# Patient Record
Sex: Male | Born: 1990 | Race: White | Hispanic: No | Marital: Married | Smoking: Never smoker
Health system: Southern US, Community
[De-identification: ages and names within clinical notes are randomized; demographics above are authoritative.]

## PROBLEM LIST (undated history)

## (undated) DIAGNOSIS — F909 Attention-deficit hyperactivity disorder, unspecified type: Secondary | ICD-10-CM

## (undated) DIAGNOSIS — Z8619 Personal history of other infectious and parasitic diseases: Secondary | ICD-10-CM

## (undated) DIAGNOSIS — M2141 Flat foot [pes planus] (acquired), right foot: Secondary | ICD-10-CM

## (undated) HISTORY — PX: NO PAST SURGERIES: SHX2092

## (undated) HISTORY — DX: Personal history of other infectious and parasitic diseases: Z86.19

---
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
## 2014-06-05 ENCOUNTER — Encounter: Payer: Self-pay | Admitting: Family Medicine

## 2014-06-05 ENCOUNTER — Ambulatory Visit
Admission: EM | Admit: 2014-06-05 | Discharge: 2014-06-05 | Disposition: A | Discharge status: Home / Self Care | Payer: BLUE CROSS/BLUE SHIELD | Attending: Family Medicine | Admitting: Family Medicine

## 2014-06-05 DIAGNOSIS — R2242 Localized swelling, mass and lump, left lower limb: Secondary | ICD-10-CM | POA: Diagnosis present

## 2014-06-05 DIAGNOSIS — L02214 Cutaneous abscess of groin: Secondary | ICD-10-CM | POA: Diagnosis not present

## 2014-06-05 DIAGNOSIS — R350 Frequency of micturition: Secondary | ICD-10-CM | POA: Diagnosis present

## 2014-06-05 DIAGNOSIS — F909 Attention-deficit hyperactivity disorder, unspecified type: Secondary | ICD-10-CM | POA: Insufficient documentation

## 2014-06-05 DIAGNOSIS — R7303 Prediabetes: Secondary | ICD-10-CM

## 2014-06-05 DIAGNOSIS — R7309 Other abnormal glucose: Secondary | ICD-10-CM | POA: Diagnosis not present

## 2014-06-05 HISTORY — DX: Attention-deficit hyperactivity disorder, unspecified type: F90.9

## 2014-06-05 LAB — URINALYSIS COMPLETE WITH MICROSCOPIC (ARMC ONLY)
Bacteria, UA: NONE SEEN — AB
Bilirubin Urine: NEGATIVE
GLUCOSE, UA: NEGATIVE mg/dL
Hgb urine dipstick: NEGATIVE
Ketones, ur: NEGATIVE mg/dL
Leukocytes, UA: NEGATIVE
NITRITE: NEGATIVE
Protein, ur: NEGATIVE mg/dL
RBC / HPF: NONE SEEN RBC/hpf (ref <3)
Specific Gravity, Urine: 1.025 (ref 1.005–1.030)
Squamous Epithelial / LPF: NONE SEEN — AB
pH: 7 (ref 5.0–8.0)

## 2014-06-05 LAB — CBG MONITORING, ED: Glucose, fingerstick: 107

## 2014-06-05 LAB — GLUCOSE, CAPILLARY: GLUCOSE-CAPILLARY: 107 mg/dL — AB (ref 65–99)

## 2014-06-05 MED ORDER — MUPIROCIN 2 % EX OINT: 1.0000 "application " | TOPICAL_OINTMENT | Freq: Three times a day (TID) | CUTANEOUS | Status: DC

## 2014-06-05 MED ORDER — SULFAMETHOXAZOLE-TRIMETHOPRIM 800-160 MG PO TABS: 1.0000 | ORAL_TABLET | Freq: Two times a day (BID) | ORAL | Status: DC

## 2014-06-05 NOTE — Discharge Instructions (Signed)
Abscess An abscess is an infected area that contains a collection of pus and debris.It can occur in almost any part of the body. An abscess is also known as a furuncle or boil. CAUSES  An abscess occurs when tissue gets infected. This can occur from blockage of oil or sweat glands, infection of hair follicles, or a minor injury to the skin. As the body tries to fight the infection, pus collects in the area and creates pressure under the skin. This pressure causes pain. People with weakened immune systems have difficulty fighting infections and get certain abscesses more often.  SYMPTOMS Usually an abscess develops on the skin and becomes a painful mass that is red, warm, and tender. If the abscess forms under the skin, you may feel a moveable soft area under the skin. Some abscesses break open (rupture) on their own, but most will continue to get worse without care. The infection can spread deeper into the body and eventually into the bloodstream, causing you to feel ill.  DIAGNOSIS  Your caregiver will take your medical history and perform a physical exam. A sample of fluid may also be taken from the abscess to determine what is causing your infection. TREATMENT  Your caregiver may prescribe antibiotic medicines to fight the infection. However, taking antibiotics alone usually does not cure an abscess. Your caregiver may need to make a small cut (incision) in the abscess to drain the pus. In some cases, gauze is packed into the abscess to reduce pain and to continue draining the area. HOME CARE INSTRUCTIONS   Only take over-the-counter or prescription medicines for pain, discomfort, or fever as directed by your caregiver.  If you were prescribed antibiotics, take them as directed. Finish them even if you start to feel better.  If gauze is used, follow your caregiver's directions for changing the gauze.  To avoid spreading the infection:  Keep your draining abscess covered with a  bandage.  Wash your hands well.  Do not share personal care items, towels, or whirlpools with others.  Avoid skin contact with others.  Keep your skin and clothes clean around the abscess.  Keep all follow-up appointments as directed by your caregiver. SEEK MEDICAL CARE IF:   You have increased pain, swelling, redness, fluid drainage, or bleeding.  You have muscle aches, chills, or a general ill feeling.  You have a fever. MAKE SURE YOU:   Understand these instructions.  Will watch your condition.  Will get help right away if you are not doing well or get worse. Document Released: 10/14/2004 Document Revised: 07/06/2011 Document Reviewed: 03/19/2011 [facility] Patient Information 2015 [place], [place]. This information is not intended to replace advice given to you by your health care provider. Make sure you discuss any questions you have with your health care provider.  Abscess An abscess is an infected area that contains a collection of pus and debris.It can occur in almost any part of the body. An abscess is also known as a furuncle or boil. CAUSES  An abscess occurs when tissue gets infected. This can occur from blockage of oil or sweat glands, infection of hair follicles, or a minor injury to the skin. As the body tries to fight the infection, pus collects in the area and creates pressure under the skin. This pressure causes pain. People with weakened immune systems have difficulty fighting infections and get certain abscesses more often.  SYMPTOMS Usually an abscess develops on the skin and becomes a painful mass that is red,  warm, and tender. If the abscess forms under the skin, you may feel a moveable soft area under the skin. Some abscesses break open (rupture) on their own, but most will continue to get worse without care. The infection can spread deeper into the body and eventually into the bloodstream, causing you to feel ill.  DIAGNOSIS  Your caregiver will take your  medical history and perform a physical exam. A sample of fluid may also be taken from the abscess to determine what is causing your infection. TREATMENT  Your caregiver may prescribe antibiotic medicines to fight the infection. However, taking antibiotics alone usually does not cure an abscess. Your caregiver may need to make a small cut (incision) in the abscess to drain the pus. In some cases, gauze is packed into the abscess to reduce pain and to continue draining the area. HOME CARE INSTRUCTIONS   Only take over-the-counter or prescription medicines for pain, discomfort, or fever as directed by your caregiver.  If you were prescribed antibiotics, take them as directed. Finish them even if you start to feel better.  If gauze is used, follow your caregiver's directions for changing the gauze.  To avoid spreading the infection:  Keep your draining abscess covered with a bandage.  Wash your hands well.  Do not share personal care items, towels, or whirlpools with others.  Avoid skin contact with others.  Keep your skin and clothes clean around the abscess.  Keep all follow-up appointments as directed by your caregiver. SEEK MEDICAL CARE IF:   You have increased pain, swelling, redness, fluid drainage, or bleeding.  You have muscle aches, chills, or a general ill feeling.  You have a fever. MAKE SURE YOU:   Understand these instructions.  Will watch your condition.  Will get help right away if you are not doing well or get worse. Document Released: 10/14/2004 Document Revised: 07/06/2011 Document Reviewed: 03/19/2011 [facility] Patient Information 2015 [place], [place]. This information is not intended to replace advice given to you by your health care provider. Make sure you discuss any questions you have with your health care provider.  Abscess Care After An abscess (also called a boil or furuncle) is an infected area that contains a collection of pus. Signs and symptoms of  an abscess include pain, tenderness, redness, or hardness, or you may feel a moveable soft area under your skin. An abscess can occur anywhere in the body. The infection may spread to surrounding tissues causing cellulitis. A cut (incision) by the surgeon was made over your abscess and the pus was drained out. Gauze may have been packed into the space to provide a drain that will allow the cavity to heal from the inside outwards. The boil may be painful for 5 to 7 days. Most people with a boil do not have high fevers. Your abscess, if seen early, may not have localized, and may not have been lanced. If not, another appointment may be required for this if it does not get better on its own or with medications. HOME CARE INSTRUCTIONS   Only take over-the-counter or prescription medicines for pain, discomfort, or fever as directed by your caregiver.  When you bathe, soak and then remove gauze or iodoform packs at least daily or as directed by your caregiver. You may then wash the wound gently with mild soapy water. Repack with gauze or do as your caregiver directs. SEEK IMMEDIATE MEDICAL CARE IF:   You develop increased pain, swelling, redness, drainage, or bleeding in the  wound site.  You develop signs of generalized infection including muscle aches, chills, fever, or a general ill feeling.  An oral temperature above 102 F (38.9 C) develops, not controlled by medication. See your caregiver for a recheck if you develop any of the symptoms described above. If medications (antibiotics) were prescribed, take them as directed. Document Released: 07/23/2004 Document Revised: 03/29/2011 Document Reviewed: 03/20/2007 [facility] Patient Information 2015 [place], [place]. This information is not intended to replace advice given to you by your health care provider. Make sure you discuss any questions you have with your health care provider.

## 2014-06-05 NOTE — ED Provider Notes (Signed)
CSN: [id number]     Arrival date & time 06/05/14  1302 History   None    Chief Complaint  Patient presents with  . Urinary Frequency    1 month  . Mass    inner thigh-left   (Consider location/radiation/quality/duration/timing/severity/associated sxs/prior Treatment) HPI is a 24 year old gentleman who presents with 2 problems. The first is a lump on his inner left thigh medially near his groin. He states that last year he had a similar lump seen by a physician who told him it was a lymph node eventually subsided and has not bothered him since. He states that it's tender to the touch and rubs against his opposite thigh with walking which makes it irritated. Second problem is that of  frequency with urination but no nocturia. He denies any penile discharge. [place] any pain in his scrotum or penis. There is no decrease in the amount of urination but he does have slight urgency. He has had the same sexual contact for several years. He denies dysuria. He denies any blood or mucus in his urine.  Past Medical History  Diagnosis Date  . ADHD (attention deficit hyperactivity disorder)    History reviewed. No pertinent past surgical history. Family History  Problem Relation Age of Onset  . Family history unknown: Yes   History  Substance Use Topics  . Smoking status: Never Smoker   . Smokeless tobacco: Never Used  . Alcohol Use: Yes     Comment: occational    Review of Systems  Genitourinary: Positive for urgency and frequency.  All other systems reviewed and are negative.   Allergies  Review of patient's allergies indicates no known allergies.  Home Medications   Prior to Admission medications   Medication Sig Start Date End Date Taking? Authorizing Provider  mupirocin ointment (BACTROBAN) 2 % Apply 1 application topically 3 (three) times daily. 06/05/14   Lutricia FeilWilliam P Roemer, PA-C  sulfamethoxazole-trimethoprim (BACTRIM DS,SEPTRA DS) 800-160 MG per tablet Take 1 tablet by mouth 2 (two)  times daily. 06/05/14   Chrissie NoaWilliam P Roemer, PA-C   BP 160/94 mmHg  Pulse 80  Temp(Src) 99.8 F (37.7 C) (Oral)  Resp 16  Ht 5\' 11"  (1.803 m)  Wt 190 lb (86.183 kg)  BMI 26.51 kg/m2  SpO2 97% Physical Exam  Constitutional: He is oriented to person, place, and time. He appears well-developed and well-nourished.  HENT:  Head: Normocephalic and atraumatic.  Eyes: Pupils are equal, round, and reactive to light.  Neck: Normal range of motion. Neck supple.  Cardiovascular: Normal rate and regular rhythm.   Pulmonary/Chest: Effort normal and breath sounds normal.  Abdominal: Soft. Bowel sounds are normal. He exhibits no distension and no mass. There is no tenderness. There is no rebound and no guarding.  Genitourinary: Penis normal. No penile tenderness.  Examination shows a circumcised normal male phallus. Scrotum is nontender. Testes shows no masses. The epididymis is nontender. There is no charge from the penile meatus.  Musculoskeletal: Normal range of motion.  Neurological: He is alert and oriented to person, place, and time. He has normal reflexes.  Skin: Skin is dry.  Imaging the proximal left thigh lesion near the inguinal fold shows an erythematous: Tender area of folliculitis that is not pointing nor draining. There is induration present but no fluctuance. No lymphadenopathy is appreciated.    ED Course  Procedures (including critical care time) Labs Review Labs Reviewed  URINALYSIS COMPLETEWITH MICROSCOPIC [facility]  - Abnormal; Notable for the following:  Bacteria, UA NONE SEEN (*)    Squamous Epithelial / LPF NONE SEEN (*)    All other components within normal limits  GLUCOSE, CAPILLARY - Abnormal; Notable for the following:    Glucose-Capillary 107 (*)    All other components within normal limits  HEMOGLOBIN A1C  CBG MONITORING, ED    Imaging Review No results found.   MDM   1. Abscess of groin, left   2. Pre-diabetes     New Prescriptions   MUPIROCIN  OINTMENT (BACTROBAN) 2 %    Apply 1 application topically 3 (three) times daily.   SULFAMETHOXAZOLE-TRIMETHOPRIM (BACTRIM DS,SEPTRA DS) 800-160 MG PER TABLET    Take 1 tablet by mouth 2 (two) times daily.    L long discussion with the patient regarding her findings today. For his abscess in his left groin he will apply warm compresses 4 times a day. He will leave the compresses in place for 10 minutes. He'll follow this by drying and then applying Bactroban ointment 3 times a day with his last application at nighttime. His frequency could certainly be from prediabetes with a random CBG of 107. We had a long talk regarding this and he preferred to have been a hemoglobin A1c drawn today and await results. If the results are on the high side then he will follow-up immediately with [facility] for evaluation and treatment. In any regard I believe that he needs to follow-up with [facility] to follow his blood glucoses and the urinary frequency is been experiencing. I've also given him a prescription for Septra DS No. 10 that he would take 1 twice a day to assist in healing the abscess in the left groin.   Lutricia FeilWilliam P Roemer, PA-C 06/05/14 1455

## 2014-06-05 NOTE — ED Notes (Addendum)
Patient states he had a swollen lymph node on the left inner thigh 1 year ago and the lymph swelling has come back.  He also has urinary frequency that started about a month ago. He has not changed any drinking habits. He has been getting up at night to urinate. He is also thirsty in the night. He has a family history of diabetes.

## 2014-06-06 LAB — HEMOGLOBIN A1C: Hgb A1c MFr Bld: 5.1 % (ref 4.0–6.0)

## 2015-06-06 ENCOUNTER — Encounter: Payer: Self-pay | Admitting: Emergency Medicine

## 2015-06-06 ENCOUNTER — Ambulatory Visit
Admission: EM | Admit: 2015-06-06 | Discharge: 2015-06-06 | Disposition: A | Discharge status: Home / Self Care | Payer: Managed Care, Other (non HMO) | Attending: Family Medicine | Admitting: Family Medicine

## 2015-06-06 DIAGNOSIS — L0231 Cutaneous abscess of buttock: Secondary | ICD-10-CM | POA: Diagnosis not present

## 2015-06-06 MED ORDER — SULFAMETHOXAZOLE-TRIMETHOPRIM 800-160 MG PO TABS: 1.0000 | ORAL_TABLET | Freq: Two times a day (BID) | ORAL | Status: DC

## 2015-06-06 NOTE — ED Notes (Signed)
Patient c/o red bump on his buttock since Tuesday.  Patient reports redness, swelling and tenderness at the site.

## 2015-06-06 NOTE — Discharge Instructions (Signed)
Abscess An abscess is an infected area that contains a collection of pus and debris.It can occur in almost any part of the body. An abscess is also known as a furuncle or boil. CAUSES  An abscess occurs when tissue gets infected. This can occur from blockage of oil or sweat glands, infection of hair follicles, or a minor injury to the skin. As the body tries to fight the infection, pus collects in the area and creates pressure under the skin. This pressure causes pain. People with weakened immune systems have difficulty fighting infections and get certain abscesses more often.  SYMPTOMS Usually an abscess develops on the skin and becomes a painful mass that is red, warm, and tender. If the abscess forms under the skin, you may feel a moveable soft area under the skin. Some abscesses break open (rupture) on their own, but most will continue to get worse without care. The infection can spread deeper into the body and eventually into the bloodstream, causing you to feel ill.  DIAGNOSIS  Your caregiver will take your medical history and perform a physical exam. A sample of fluid may also be taken from the abscess to determine what is causing your infection. TREATMENT  Your caregiver may prescribe antibiotic medicines to fight the infection. However, taking antibiotics alone usually does not cure an abscess. Your caregiver may need to make a small cut (incision) in the abscess to drain the pus. In some cases, gauze is packed into the abscess to reduce pain and to continue draining the area. HOME CARE INSTRUCTIONS   Only take over-the-counter or prescription medicines for pain, discomfort, or fever as directed by your caregiver.  If you were prescribed antibiotics, take them as directed. Finish them even if you start to feel better.  If gauze is used, follow your caregiver's directions for changing the gauze.  To avoid spreading the infection:  Keep your draining abscess covered with a  bandage.  Wash your hands well.  Do not share personal care items, towels, or whirlpools with others.  Avoid skin contact with others.  Keep your skin and clothes clean around the abscess.  Keep all follow-up appointments as directed by your caregiver. SEEK MEDICAL CARE IF:   You have increased pain, swelling, redness, fluid drainage, or bleeding.  You have muscle aches, chills, or a general ill feeling.  You have a fever. MAKE SURE YOU:   Understand these instructions.  Will watch your condition.  Will get help right away if you are not doing well or get worse.   This information is not intended to replace advice given to you by your health care provider. Make sure you discuss any questions you have with your health care provider.   Document Released: 10/14/2004 Document Revised: 07/06/2011 Document Reviewed: 03/19/2011 Elsevier Interactive Patient Education [employer]   Follow up as needed if symptoms worsen

## 2015-08-06 DIAGNOSIS — L0231 Cutaneous abscess of buttock: Secondary | ICD-10-CM | POA: Diagnosis present

## 2015-08-06 NOTE — ED Provider Notes (Signed)
CSN: [id number]     Arrival date & time 06/06/15  0957 History   First MD Initiated Contact with Patient 06/06/15 1110     Chief Complaint  Patient presents with  . Abscess   (Consider location/radiation/quality/duration/timing/severity/associated sxs/prior Treatment) Patient is a 25 y.o. male presenting with abscess. The history is provided by the patient.  Abscess Abscess location: buttock. Abscess quality: draining (4cm)   Red streaking: yes   Duration:  4 days Progression:  Unchanged Chronicity:  New Context: not diabetes, not immunosuppression, not injected drug use, not insect bite/sting and not skin injury   Relieved by:  Warm compresses Associated symptoms: no anorexia, no fatigue, no fever, no headaches, no nausea and no vomiting   Risk factors: prior abscess     Past Medical History  Diagnosis Date  . ADHD (attention deficit hyperactivity disorder)    History reviewed. No pertinent past surgical history. Family History  Problem Relation Age of Onset  . Family history unknown: Yes   Social History  Substance Use Topics  . Smoking status: Never Smoker   . Smokeless tobacco: Never Used  . Alcohol Use: Yes     Comment: occational    Review of Systems  Constitutional: Negative for fever and fatigue.  Gastrointestinal: Negative for nausea, vomiting and anorexia.  Neurological: Negative for headaches.    Allergies  Review of patient's allergies indicates no known allergies.  Home Medications   Prior to Admission medications   Medication Sig Start Date End Date Taking? Authorizing Provider  mupirocin ointment (BACTROBAN) 2 % Apply 1 application topically 3 (three) times daily. 06/05/14   Lutricia FeilWilliam P Roemer, PA-C  sulfamethoxazole-trimethoprim (BACTRIM DS,SEPTRA DS) 800-160 MG tablet Take 1 tablet by mouth 2 (two) times daily. 06/06/15   Payton Mccallumrlando Christan Defranco, MD   Meds Ordered and Administered this Visit  Medications - No data to display  BP 145/90 mmHg  Pulse 77   Temp(Src) 98.2 F (36.8 C) (Tympanic)  Resp 16  Ht 5\' 11"  (1.803 m)  Wt 185 lb (83.915 kg)  BMI 25.81 kg/m2  SpO2 100% No data found.   Physical Exam  Constitutional: He appears well-developed and well-nourished. No distress.  Skin: He is not diaphoretic.  Right buttock with blanchable erythema, tenderness to palpation and warmth with slight drainage.  Nursing note and vitals reviewed.   ED Course  Procedures (including critical care time)  Labs Review Labs Reviewed - No data to display  Imaging Review No results found.   Visual Acuity Review  Right Eye Distance:   Left Eye Distance:   Bilateral Distance:    Right Eye Near:   Left Eye Near:    Bilateral Near:         MDM    Abscess buttock    Discharge Medication List as of 06/06/2015 12:11 PM    1. diagnosis reviewed with patient; area cleaned and anesthetized with 1% lidocaine and small incision made with 11 blade with drainage of purulent material 2. rx as per orders above; reviewed possible side effects, interactions, risks and benefits; rx for bactrim as per orders 3. Recommend supportive treatment with warm compresses to area 4. Follow-up prn if symptoms worsen or don't improve   Payton Mccallumrlando Idalys Konecny, MD 08/06/15 [phone]

## 2017-04-12 ENCOUNTER — Ambulatory Visit
Admission: EM | Admit: 2017-04-12 | Discharge: 2017-04-12 | Disposition: A | Discharge status: Home / Self Care | Payer: BLUE CROSS/BLUE SHIELD | Attending: Family Medicine | Admitting: Family Medicine

## 2017-04-12 ENCOUNTER — Other Ambulatory Visit: Payer: Self-pay

## 2017-04-12 DIAGNOSIS — M7652 Patellar tendinitis, left knee: Secondary | ICD-10-CM

## 2017-04-12 NOTE — ED Provider Notes (Signed)
[facility]    CSN: [id number] Arrival date & time: 04/12/17  0912     History   Chief Complaint Chief Complaint  Patient presents with  . Knee Pain    left    HPI William Meyers is a 27 y.o. male.   The history is provided by the patient.  Knee Pain  Location:  Knee Time since incident:  1 week Injury: no   Knee location:  L knee Pain details:    Quality:  Aching Chronicity:  New Dislocation: no   Prior injury to area:  No Relieved by:  None tried Exacerbated by: kneeling. Ineffective treatments:  None tried Associated symptoms: no back pain, no decreased ROM, no fatigue, no itching, no muscle weakness, no neck pain, no numbness, no stiffness, no swelling and no tingling   Risk factors: no known bone disorder, no obesity and no recent illness     Past Medical History:  Diagnosis Date  . ADHD (attention deficit hyperactivity disorder)     Patient Active Problem List   Diagnosis Date Noted  . Abscess of buttock, right 08/06/2015    Past Surgical History:  Procedure Laterality Date  . NO PAST SURGERIES         Home Medications    Prior to Admission medications   Medication Sig Start Date End Date Taking? Authorizing Provider  mupirocin ointment (BACTROBAN) 2 % Apply 1 application topically 3 (three) times daily. 06/05/14   Lutricia Feil, PA-C  sulfamethoxazole-trimethoprim (BACTRIM DS,SEPTRA DS) 800-160 MG tablet Take 1 tablet by mouth 2 (two) times daily. 06/06/15   Payton Mccallum, MD    Family History Family History  Family history unknown: Yes    Social History Social History   Tobacco Use  . Smoking status: Never Smoker  . Smokeless tobacco: Never Used  Substance Use Topics  . Alcohol use: Yes    Comment: rarely  . Drug use: No     Allergies   Patient has no known allergies.   Review of Systems Review of Systems  Constitutional: Negative for fatigue.  Musculoskeletal: Negative for back pain, neck pain and  stiffness.  Skin: Negative for itching.     Physical Exam Triage Vital Signs ED Triage Vitals  Enc Vitals Group     BP 04/12/17 0937 129/78     Pulse Rate 04/12/17 0937 (!) 50     Resp 04/12/17 0937 18     Temp 04/12/17 0937 98.3 F (36.8 C)     Temp Source 04/12/17 0937 Oral     SpO2 04/12/17 0937 100 %     Weight 04/12/17 0935 185 lb (83.9 kg)     Height 04/12/17 0935 5\' 11"  (1.803 m)     Head Circumference --      Peak Flow --      Pain Score 04/12/17 0935 10     Pain Loc --      Pain Edu? --      Excl. in GC? --    No data found.  Updated Vital Signs BP 129/78 (BP Location: Left Arm)   Pulse (!) 50   Temp 98.3 F (36.8 C) (Oral)   Resp 18   Ht 5\' 11"  (1.803 m)   Wt 185 lb (83.9 kg)   SpO2 100%   BMI 25.80 kg/m   Visual Acuity Right Eye Distance:   Left Eye Distance:   Bilateral Distance:    Right Eye Near:   Left Eye Near:  Bilateral Near:     Physical Exam  Constitutional: He appears well-developed and well-nourished. No distress.  Musculoskeletal:       Left knee: He exhibits normal range of motion, no swelling, no effusion, no ecchymosis, no deformity, no laceration, no erythema, normal alignment, no LCL laxity, normal patellar mobility, no bony tenderness, normal meniscus and no MCL laxity. Tenderness found. Patellar tendon tenderness noted.  Skin: He is not diaphoretic.  Nursing note and vitals reviewed.    UC Treatments / Results  Labs (all labs ordered are listed, but only abnormal results are displayed) Labs Reviewed - No data to display  EKG None Radiology No results found.  Procedures Procedures (including critical care time)  Medications Ordered in UC Medications - No data to display   Initial Impression / Assessment and Plan / UC Course  I have reviewed the triage vital signs and the nursing notes.  Pertinent labs & imaging results that were available during my care of the patient were reviewed by me and considered in my  medical decision making (see chart for details).      Final Clinical Impressions(s) / UC Diagnoses   Final diagnoses:  Patellar tendinitis of left knee    ED Discharge Orders    None     1. diagnosis reviewed with patient 2. rx as per orders above; reviewed possible side effects, interactions, risks and benefits  3. Recommend supportive treatment with ice, otc nsaids, use of knee pads if kneeling 4. Follow-up prn if symptoms worsen or don't improve   Controlled Substance Prescriptions [place] Controlled Substance Registry consulted? Not Applicable   Payton Mccallumonty, Marissa Weaver, MD 04/12/17 1051

## 2017-04-12 NOTE — ED Triage Notes (Signed)
Patient complains of left knee pain that worsens when he applies pressure to the knee. Patient describes the pain as a burning sensation.

## 2017-04-12 NOTE — Discharge Instructions (Signed)
Ibuprofen 600mg  three times a day; ice/heat; knee pads

## 2017-12-26 ENCOUNTER — Encounter: Payer: Self-pay | Admitting: Emergency Medicine

## 2017-12-26 ENCOUNTER — Other Ambulatory Visit: Payer: Self-pay

## 2017-12-26 ENCOUNTER — Ambulatory Visit
Admission: EM | Admit: 2017-12-26 | Discharge: 2017-12-26 | Disposition: A | Discharge status: Home / Self Care | Payer: BLUE CROSS/BLUE SHIELD | Attending: Family Medicine | Admitting: Family Medicine

## 2017-12-26 DIAGNOSIS — J111 Influenza due to unidentified influenza virus with other respiratory manifestations: Secondary | ICD-10-CM

## 2017-12-26 DIAGNOSIS — R69 Illness, unspecified: Secondary | ICD-10-CM | POA: Diagnosis not present

## 2017-12-26 DIAGNOSIS — R509 Fever, unspecified: Secondary | ICD-10-CM

## 2017-12-26 LAB — RAPID INFLUENZA A&B ANTIGENS
Influenza A (ARMC): NEGATIVE
Influenza B (ARMC): NEGATIVE

## 2017-12-26 LAB — RAPID STREP SCREEN (MED CTR MEBANE ONLY): Streptococcus, Group A Screen (Direct): NEGATIVE

## 2017-12-26 MED ORDER — AMOXICILLIN-POT CLAVULANATE 875-125 MG PO TABS: 1.0000 | ORAL_TABLET | Freq: Two times a day (BID) | ORAL | 0 refills | Status: DC

## 2017-12-26 NOTE — Discharge Instructions (Signed)
Rest. ° °Fluids. ° °Ibuprofen as needed. ° °Antibiotic as prescribed. ° °Take care ° °Dr. Elbert Polyakov °

## 2017-12-26 NOTE — ED Provider Notes (Signed)
[facility]    CSN: [id number] Arrival date & time: 12/26/17  1057  History   Chief Complaint Chief Complaint  Patient presents with  . Sore Throat  . Fever   HPI  27 year old male presents with respiratory symptoms.  Patient states that he has been sick since Thursday.  Started with postnasal drip and then progressed to congestion, fever, sore throat, chills, body aches.  His significant other is sick with similar symptoms.  However, she has no fever.  He reports persistent fever, T-max 103.  Has been treating himself with NyQuil, Sudafed, and Motrin without resolution.  Feels fatigued.  Symptoms are severe.  No known exacerbating factors.  No other associated symptoms.  No other complaints.  PMH, Surgical Hx, Family Hx, Social History reviewed and updated as below.  Past Medical History:  Diagnosis Date  . ADHD (attention deficit hyperactivity disorder)    Patient Active Problem List   Diagnosis Date Noted  . Abscess of buttock, right 08/06/2015    Past Surgical History:  Procedure Laterality Date  . NO PAST SURGERIES     Family History Family History  Family history unknown: Yes   Social History Social History   Tobacco Use  . Smoking status: Never Smoker  . Smokeless tobacco: Never Used  Substance Use Topics  . Alcohol use: Yes    Comment: rarely  . Drug use: No   Allergies   Patient has no known allergies.   Review of Systems Review of Systems Per HPI  Physical Exam Triage Vital Signs ED Triage Vitals  Enc Vitals Group     BP 12/26/17 1136 (!) 138/95     Pulse Rate 12/26/17 1136 70     Resp 12/26/17 1136 18     Temp 12/26/17 1136 98.3 F (36.8 C)     Temp Source 12/26/17 1136 Oral     SpO2 12/26/17 1136 100 %     Weight 12/26/17 1133 180 lb (81.6 kg)     Height 12/26/17 1133 5\' 11"  (1.803 m)     Head Circumference --      Peak Flow --      Pain Score 12/26/17 1132 5     Pain Loc --      Pain Edu? --      Excl. in GC? --     Updated Vital Signs BP (!) 138/95 (BP Location: Left Arm)   Pulse 70   Temp 98.3 F (36.8 C) (Oral)   Resp 18   Ht 5\' 11"  (1.803 m)   Wt 81.6 kg   SpO2 100%   BMI 25.10 kg/m   Visual Acuity Right Eye Distance:   Left Eye Distance:   Bilateral Distance:    Right Eye Near:   Left Eye Near:    Bilateral Near:     Physical Exam  Constitutional: He is oriented to person, place, and time. He appears well-developed. No distress.  HENT:  Head: Normocephalic and atraumatic.  Mouth/Throat: Oropharynx is clear and moist.  Eyes: Conjunctivae are normal. Right eye exhibits no discharge. Left eye exhibits no discharge.  Cardiovascular: Normal rate and regular rhythm.  Pulmonary/Chest: Effort normal and breath sounds normal. He has no wheezes. He has no rales.  Neurological: He is alert and oriented to person, place, and time.  Psychiatric: He has a normal mood and affect. His behavior is normal.  Nursing note and vitals reviewed.  UC Treatments / Results  Labs (all labs ordered are listed, but only  abnormal results are displayed) Labs Reviewed  RAPID STREP SCREEN ([facility] ONLY)  RAPID INFLUENZA A&B ANTIGENS ([facility] ONLY)  CULTURE, GROUP A STREP [facility])    EKG None  Radiology No results found.  Procedures Procedures (including critical care time)  Medications Ordered in UC Medications - No data to display  Initial Impression / Assessment and Plan / UC Course  I have reviewed the triage vital signs and the nursing notes.  Pertinent labs & imaging results that were available during my care of the patient were reviewed by me and considered in my medical decision making (see chart for details).    27 year old male presents with influenza-like illness.  Given persistent fever and lack of improvement, I am placing him on Augmentin to cover for sinusitis.  Final Clinical Impressions(s) / UC Diagnoses   Final diagnoses:  Influenza-like illness     Discharge  Instructions     Rest. Fluids.  Ibuprofen as needed.  Antibiotic as prescribed.  Take care  Dr. Adriana Simasook    ED Prescriptions    Medication Sig Dispense Auth. Provider   amoxicillin-clavulanate (AUGMENTIN) 875-125 MG tablet Take 1 tablet by mouth every 12 (twelve) hours. 14 tablet Tommie Samsook, Sevyn Markham G, DO     Controlled Substance Prescriptions [place] Controlled Substance Registry consulted? Not Applicable   Tommie SamsCook, Monseratt Ledin G, DO 12/26/17 1457

## 2017-12-26 NOTE — ED Triage Notes (Signed)
Pt c/o post nasal drip, nasal congestion, fever (101-103) sore throat, sinus pain/pressure, chills and body aches. Started 5 days ago.

## 2017-12-29 LAB — CULTURE, GROUP A STREP (THRC)

## 2019-01-01 ENCOUNTER — Encounter: Payer: Self-pay | Admitting: Emergency Medicine

## 2019-01-01 ENCOUNTER — Ambulatory Visit
Admission: EM | Admit: 2019-01-01 | Discharge: 2019-01-01 | Disposition: A | Discharge status: Home / Self Care | Payer: Self-pay | Attending: Family Medicine | Admitting: Family Medicine

## 2019-01-01 ENCOUNTER — Other Ambulatory Visit: Payer: Self-pay

## 2019-01-01 DIAGNOSIS — M79671 Pain in right foot: Secondary | ICD-10-CM

## 2019-01-01 HISTORY — DX: Flat foot (pes planus) (acquired), right foot: M21.41

## 2019-01-01 MED ORDER — MELOXICAM 15 MG PO TABS: 15.0000 mg | ORAL_TABLET | Freq: Every day | ORAL | 0 refills | Status: DC | PRN

## 2019-01-01 NOTE — Discharge Instructions (Signed)
Take medication as prescribed. Rest. Ice. Compression sleeve and arch supports over the counter.   Follow up with podiatry this week as discussed.   Follow up with your primary care physician this week as needed. Return to Urgent care for new or worsening concerns.

## 2019-01-01 NOTE — ED Triage Notes (Signed)
Patient in today c/o right foot pain x 3 days. No injury noted. Patient has tried ice/heat and elevation without relief.

## 2019-01-01 NOTE — ED Provider Notes (Signed)
[facility] ____________________________________________  Time seen: Approximately 11:09 AM  I have reviewed the triage vital signs and the nursing notes.   HISTORY  Chief Complaint Foot Pain (right)   HPI William Meyers is a 28 y.o. male presenting for evaluation of right medial foot pain present for the last 3 to 4 days.  Patient reports pain started first without any direct injury.  Reports he has subsequently rolled out of bed and hit his foot but denies any changes from that pain or injury.  Patient reports pain is mostly with activity and increases with the more activity he does.  Occasionally has some pain radiation to the right lower inner leg.  Denies paresthesias or decreased range of motion.  States he has flat feet and sometimes has pain in his arch and has mild pain in his arch now without acute changes.  Has been trying some ice and heat without resolution.  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  Denies swelling, paresthesias.  Denies fevers or recent sickness.  Reports otherwise doing well.   Past Medical History:  Diagnosis Date  . ADHD (attention deficit hyperactivity disorder)   . Flat feet, bilateral     Patient Active Problem List   Diagnosis Date Noted  . Abscess of buttock, right 08/06/2015    Past Surgical History:  Procedure Laterality Date  . NO PAST SURGERIES       No current facility-administered medications for this encounter.  Current Outpatient Medications:  .  meloxicam (MOBIC) 15 MG tablet, Take 1 tablet (15 mg total) by mouth daily as needed., Disp: 14 tablet, Rfl: 0  Allergies Patient has no known allergies.  Family History  Problem Relation Age of Onset  . Pneumonia Mother   . Heart disease Mother   . Other Father        unknown medical history    Social History Social History   Tobacco Use  . Smoking status: Never Smoker  . Smokeless tobacco:  Never Used  Substance Use Topics  . Alcohol use: Yes    Comment: rarely  . Drug use: No    Review of Systems Constitutional: No fever ENT: No sore throat. Cardiovascular: Denies chest pain. Respiratory: Denies shortness of breath. Musculoskeletal: Positive right foot pain. Skin: Negative for rash.  ____________________________________________   PHYSICAL EXAM:  VITAL SIGNS: ED Triage Vitals  Enc Vitals Group     BP 01/01/19 1034 (!) 153/94     Pulse Rate 01/01/19 1034 64     Resp 01/01/19 1034 18     Temp 01/01/19 1034 98.7 F (37.1 C)     Temp Source 01/01/19 1034 Oral     SpO2 01/01/19 1034 98 %     Weight 01/01/19 1033 180 lb (81.6 kg)     Height 01/01/19 1033 5\' 11"  (1.803 m)     Head Circumference --      Peak Flow --      Pain Score 01/01/19 1033 6     Pain Loc --      Pain Edu? --      Excl. in [place]? --     Constitutional: Alert and oriented. Well appearing and in no acute distress. Eyes: Conjunctivae are normal.  ENT      Head: Normocephalic and atraumatic. Cardiovascular:  Good peripheral circulation. Respiratory: Normal respiratory effort without tachypnea nor retractions. Musculoskeletal: Steady gait.  Right foot distal posterior tibialis  and dorsalis pedis pulses equal and strong. Except: Right medial midfoot to right medial malleolus tenderness direct palpation, no swelling, no ecchymosis, no clear point bony tenderness, full range of motion present, normal to sensation and capillary refill, no plantar fascia tenderness, right lower extremity otherwise nontender.  Flat feet. Neurologic:  Normal speech and language. Speech is normal. No gait instability.  Skin:  Skin is warm, dry and intact. No rash noted. Psychiatric: Mood and affect are normal. Speech and behavior are normal. Patient exhibits appropriate insight and judgment   ___________________________________________   LABS (all labs ordered are listed, but only abnormal results are displayed)   Labs Reviewed - No data to display  RADIOLOGY  No results found. ____________________________________________   PROCEDURES Procedures    INITIAL IMPRESSION / ASSESSMENT AND PLAN / ED COURSE  Pertinent labs & imaging results that were available during my care of the patient were reviewed by me and considered in my medical decision making (see chart for details).  Well-appearing patient.  No acute distress.  Right foot pain prior to any injury.  As her has been injury, recommended x-ray, patient declined x-ray at this time.  Suspect tendinitis.  Will treat with oral daily Mobic.  Recommend over-the-counter arch supports and sleeve brace.  Follow-up with podiatry.  Patient requests work note, work note for today and tomorrow given.  Ice, supportive care.Discussed indication, risks and benefits of medications with patient.   Discussed follow up with Primary care physician this week. Discussed follow up and return parameters including no resolution or any worsening concerns. Patient verbalized understanding and agreed to plan.   ____________________________________________   FINAL CLINICAL IMPRESSION(S) / ED DIAGNOSES  Final diagnoses:  Right foot pain     ED Discharge Orders         Ordered    meloxicam (MOBIC) 15 MG tablet  Daily PRN     01/01/19 1107           Note: This dictation was prepared with Dragon dictation along with smaller phrase technology. Any transcriptional errors that result from this process are unintentional.         Renford Dills, NP 01/01/19 1130

## 2021-09-07 ENCOUNTER — Encounter: Payer: Self-pay | Admitting: Family Medicine

## 2021-09-07 ENCOUNTER — Ambulatory Visit: Payer: 59 | Admitting: Family Medicine

## 2021-09-07 VITALS — BP 130/82 | HR 56 | Temp 97.7°F | Ht 69.4 in | Wt 186.2 lb

## 2021-09-07 DIAGNOSIS — R5382 Chronic fatigue, unspecified: Secondary | ICD-10-CM

## 2021-09-07 DIAGNOSIS — R001 Bradycardia, unspecified: Secondary | ICD-10-CM | POA: Insufficient documentation

## 2021-09-07 DIAGNOSIS — M2140 Flat foot [pes planus] (acquired), unspecified foot: Secondary | ICD-10-CM | POA: Insufficient documentation

## 2021-09-07 DIAGNOSIS — M2142 Flat foot [pes planus] (acquired), left foot: Secondary | ICD-10-CM

## 2021-09-07 DIAGNOSIS — M2141 Flat foot [pes planus] (acquired), right foot: Secondary | ICD-10-CM

## 2021-09-07 NOTE — Assessment & Plan Note (Signed)
Longstanding per patient, asymptomatic.  Will check baseline EKG today.

## 2021-09-07 NOTE — Progress Notes (Addendum)
Patient ID: William Meyers, male    DOB: 1990/12/20, 31 y.o.   MRN: [id number]  This visit was conducted in person.  BP 130/82   Pulse (!) 56   Temp 97.7 F (36.5 C) (Temporal)   Ht 5' 9.4" (1.763 m)   Wt 186 lb 4 oz (84.5 kg)   SpO2 97%   BMI 27.19 kg/m    CC: new pt to establish care  Subjective:   HPI: William Meyers is a 31 y.o. male presenting on 09/07/2021 for New Patient (Initial Visit)   BIL of Lequita Asal.  New baby at home - 6mo.  Over the past 2 yrs notices notices marked daytime somnolence, even though he can get 8+ hours of sleep.  No morning headaches, no snoring, no PNdyspnea or witnessed apnea.  + fatigue and low energy levels for a couple years.   Baseline low heart rate 40-50s, can drop to 37 when sleeping.   No depressed mood. Sex drive ok.   Hit R eyebrow last night with bolt.   Isn't in touch with biological mother.   Lives with wife, baby Edu: HS Occ: Bathroom remodeloer Technical brewer) Activity: stays active at work  Diet: good water, fruits/vegetables      Relevant past medical, surgical, family and social history reviewed and updated as indicated. Interim medical history since our last visit reviewed. Allergies and medications reviewed and updated. Outpatient Medications Prior to Visit  Medication Sig Dispense Refill   meloxicam (MOBIC) 15 MG tablet Take 1 tablet (15 mg total) by mouth daily as needed. 14 tablet 0   No facility-administered medications prior to visit.     Per HPI unless specifically indicated in ROS section below Review of Systems  Objective:  BP 130/82   Pulse (!) 56   Temp 97.7 F (36.5 C) (Temporal)   Ht 5' 9.4" (1.763 m)   Wt 186 lb 4 oz (84.5 kg)   SpO2 97%   BMI 27.19 kg/m   Wt Readings from Last 3 Encounters:  09/07/21 186 lb 4 oz (84.5 kg)  01/01/19 180 lb (81.6 kg)  12/26/17 180 lb (81.6 kg)      Physical Exam Vitals and nursing note reviewed.  Constitutional:      Appearance: Normal appearance.  He is not ill-appearing.  HENT:     Head: Normocephalic and atraumatic.     Mouth/Throat:     Mouth: Mucous membranes are moist.     Pharynx: Oropharynx is clear. No oropharyngeal exudate or posterior oropharyngeal erythema.  Eyes:     General:        Right eye: No discharge.        Left eye: No discharge.     Extraocular Movements: Extraocular movements intact.     Conjunctiva/sclera: Conjunctivae normal.     Pupils: Pupils are equal, round, and reactive to light.  Neck:     Thyroid: No thyroid mass or thyromegaly.  Cardiovascular:     Rate and Rhythm: Regular rhythm. Bradycardia present.     Pulses: Normal pulses.     Heart sounds: Normal heart sounds. No murmur heard. Pulmonary:     Effort: Pulmonary effort is normal. No respiratory distress.     Breath sounds: Normal breath sounds. No wheezing, rhonchi or rales.  Musculoskeletal:     Cervical back: Normal range of motion and neck supple.     Right lower leg: No edema.     Left lower leg: No edema.  Skin:  General: Skin is warm and dry.     Findings: No rash.  Neurological:     Mental Status: He is alert.  Psychiatric:        Mood and Affect: Mood normal.        Behavior: Behavior normal.      EKG - NSR rate 60, normal axis, intervals, no hypertrophy or acute ST/T changes  Assessment & Plan:   Problem List Items Addressed This Visit     Bradycardia    Longstanding per patient, asymptomatic.  Will check baseline EKG today.       Relevant Orders   EKG 12-Lead (Completed)   Chronic fatigue - Primary    Endorses chronic fatigue with daytime somnolence but without other sleep apnea symptoms. Longstanding issue.  Will check for reversible causes of fatigue  Will check am Testosterone levels.  Will be in touch with lab results.  ESS = 7. Not quite consistent with OSA, consider further eval if below unrevealing.       Relevant Orders   Comprehensive metabolic panel   TSH   CBC with Differential/Platelet    Vitamin B12   Testosterone   Pes planus    Discussed using supportive shoes with good arch support.         No orders of the defined types were placed in this encounter.  Orders Placed This Encounter  Procedures   Comprehensive metabolic panel    Standing Status:   Future    Standing Expiration Date:   09/08/2022   TSH    Standing Status:   Future    Standing Expiration Date:   09/08/2022   CBC with Differential/Platelet    Standing Status:   Future    Standing Expiration Date:   09/08/2022   Vitamin B12    Standing Status:   Future    Standing Expiration Date:   09/08/2022   Testosterone    Standing Status:   Future    Standing Expiration Date:   09/08/2022   EKG 12-Lead     Patient Instructions  Good to meet you today.  Schedule lab visit at your convenience for 8am blood work.  Return as needed or in 6 months for physical.   Follow up plan: Return in about 6 months (around 03/10/2022) for annual exam, prior fasting for blood work.  Eustaquio Boyden, MD

## 2021-09-07 NOTE — Assessment & Plan Note (Signed)
Discussed using supportive shoes with good arch support.

## 2021-09-07 NOTE — Patient Instructions (Addendum)
Good to meet you today.  Schedule lab visit at your convenience for 8am blood work.  Return as needed or in 6 months for physical.

## 2021-09-07 NOTE — Addendum Note (Signed)
Addended by: Eustaquio Boyden on: 09/07/2021 01:52 PM   Modules accepted: Orders

## 2021-09-07 NOTE — Assessment & Plan Note (Addendum)
Endorses chronic fatigue with daytime somnolence but without other sleep apnea symptoms. Longstanding issue.  Will check for reversible causes of fatigue  Will check am Testosterone levels.  Will be in touch with lab results.  ESS = 7. Not quite consistent with OSA, consider further eval if below unrevealing.

## 2021-09-14 ENCOUNTER — Other Ambulatory Visit (INDEPENDENT_AMBULATORY_CARE_PROVIDER_SITE_OTHER): Payer: 59

## 2021-09-14 DIAGNOSIS — R5382 Chronic fatigue, unspecified: Secondary | ICD-10-CM

## 2021-09-14 LAB — CBC WITH DIFFERENTIAL/PLATELET
Basophils Absolute: 0.1 10*3/uL (ref 0.0–0.1)
Basophils Relative: 1 % (ref 0.0–3.0)
Eosinophils Absolute: 0.1 10*3/uL (ref 0.0–0.7)
Eosinophils Relative: 1.6 % (ref 0.0–5.0)
HCT: 46.3 % (ref 39.0–52.0)
Hemoglobin: 16 g/dL (ref 13.0–17.0)
Lymphocytes Relative: 34.1 % (ref 12.0–46.0)
Lymphs Abs: 1.9 10*3/uL (ref 0.7–4.0)
MCHC: 34.5 g/dL (ref 30.0–36.0)
MCV: 85.4 fl (ref 78.0–100.0)
Monocytes Absolute: 0.5 10*3/uL (ref 0.1–1.0)
Monocytes Relative: 8.8 % (ref 3.0–12.0)
Neutro Abs: 3.1 10*3/uL (ref 1.4–7.7)
Neutrophils Relative %: 54.5 % (ref 43.0–77.0)
Platelets: 226 10*3/uL (ref 150.0–400.0)
RBC: 5.42 Mil/uL (ref 4.22–5.81)
RDW: 12.4 % (ref 11.5–15.5)
WBC: 5.6 10*3/uL (ref 4.0–10.5)

## 2021-09-14 LAB — COMPREHENSIVE METABOLIC PANEL
ALT: 20 U/L (ref 0–53)
AST: 15 U/L (ref 0–37)
Albumin: 4.7 g/dL (ref 3.5–5.2)
Alkaline Phosphatase: 57 U/L (ref 39–117)
BUN: 21 mg/dL (ref 6–23)
CO2: 28 mEq/L (ref 19–32)
Calcium: 9.5 mg/dL (ref 8.4–10.5)
Chloride: 101 mEq/L (ref 96–112)
Creatinine, Ser: 1.01 mg/dL (ref 0.40–1.50)
GFR: 98.51 mL/min (ref >60.00)
Glucose, Bld: 104 mg/dL — ABNORMAL HIGH (ref 70–99)
Potassium: 4.3 mEq/L (ref 3.5–5.1)
Sodium: 138 mEq/L (ref 135–145)
Total Bilirubin: 0.9 mg/dL (ref 0.2–1.2)
Total Protein: 6.8 g/dL (ref 6.0–8.3)

## 2021-09-14 LAB — TESTOSTERONE: Testosterone: 240.56 ng/dL — ABNORMAL LOW (ref 300.00–890.00)

## 2021-09-14 LAB — TSH: TSH: 1.1 u[IU]/mL (ref 0.35–5.50)

## 2021-09-14 LAB — VITAMIN B12: Vitamin B-12: 281 pg/mL (ref 211–911)

## 2021-09-15 ENCOUNTER — Other Ambulatory Visit: Payer: Self-pay | Admitting: Family Medicine

## 2021-09-15 ENCOUNTER — Encounter: Payer: Self-pay | Admitting: Family Medicine

## 2021-09-15 DIAGNOSIS — R7989 Other specified abnormal findings of blood chemistry: Secondary | ICD-10-CM | POA: Insufficient documentation

## 2021-09-15 DIAGNOSIS — E538 Deficiency of other specified B group vitamins: Secondary | ICD-10-CM | POA: Insufficient documentation

## 2021-09-15 MED ORDER — CYANOCOBALAMIN 500 MCG PO TABS: 500.0000 ug | ORAL_TABLET | Freq: Every day | ORAL | Status: DC

## 2021-09-29 ENCOUNTER — Other Ambulatory Visit: Payer: 59

## 2022-03-12 ENCOUNTER — Encounter: Payer: 59 | Admitting: Family Medicine

## 2022-05-24 ENCOUNTER — Ambulatory Visit
Admission: EM | Admit: 2022-05-24 | Discharge: 2022-05-24 | Disposition: A | Discharge status: Home / Self Care | Payer: 59 | Attending: Urgent Care | Admitting: Urgent Care

## 2022-05-24 DIAGNOSIS — Z1152 Encounter for screening for COVID-19: Secondary | ICD-10-CM | POA: Diagnosis not present

## 2022-05-24 DIAGNOSIS — R6889 Other general symptoms and signs: Secondary | ICD-10-CM | POA: Insufficient documentation

## 2022-05-24 DIAGNOSIS — R0981 Nasal congestion: Secondary | ICD-10-CM | POA: Insufficient documentation

## 2022-05-24 DIAGNOSIS — R059 Cough, unspecified: Secondary | ICD-10-CM | POA: Diagnosis not present

## 2022-05-24 DIAGNOSIS — R519 Headache, unspecified: Secondary | ICD-10-CM | POA: Diagnosis not present

## 2022-05-24 NOTE — ED Provider Notes (Signed)
William Meyers    CSN: [id number] Arrival date & time: 05/24/22  1607      History   Chief Complaint Chief Complaint  Patient presents with  . Fatigue  . Headache  . Nasal Congestion  . Cough    HPI William Meyers is a 32 y.o. male.    Headache Associated symptoms: cough   Cough Associated symptoms: headaches    Patient presents to urgent care with complaint of symptoms including fatigue, headache, nasal congestion, cough.  Symptoms starting 2-3 days ago.   Past Medical History:  Diagnosis Date  . ADHD (attention deficit hyperactivity disorder)   . Flat feet, bilateral   . History of chicken pox     Patient Active Problem List   Diagnosis Date Noted  . Low serum vitamin B12 09/15/2021  . Low testosterone in male 09/15/2021  . Bradycardia 09/07/2021  . Chronic fatigue 09/07/2021  . Pes planus 09/07/2021    Past Surgical History:  Procedure Laterality Date  . WISDOM TOOTH EXTRACTION Bilateral 2004       Home Medications    Prior to Admission medications   Medication Sig Start Date End Date Taking? Authorizing Provider  cyanocobalamin (VITAMIN B12) 500 MCG tablet Take 1 tablet (500 mcg total) by mouth daily. 09/15/21   Eustaquio Boyden, MD    Family History Family History  Problem Relation Age of Onset  . Breast cancer Mother   . Other Father        unknown medical history  . Migraines Father   . Heart Problems Paternal Grandmother   . Pneumonia Paternal Grandmother   . Stroke Paternal Grandfather   . Diabetes Neg Hx     Social History Social History   Tobacco Use  . Smoking status: Never  . Smokeless tobacco: Never  Vaping Use  . Vaping Use: Never used  Substance Use Topics  . Alcohol use: Yes    Comment: rarely  . Drug use: No     Allergies   Patient has no known allergies.   Review of Systems Review of Systems  Respiratory:  Positive for cough.   Neurological:  Positive for headaches.     Physical  Exam Triage Vital Signs ED Triage Vitals [05/24/22 1728]  Enc Vitals Group     BP 124/87     Pulse Rate 64     Resp 16     Temp 98.1 F (36.7 C)     Temp Source Oral     SpO2 97 %     Weight      Height      Head Circumference      Peak Flow      Pain Score      Pain Loc      Pain Edu?      Excl. in GC?    No data found.  Updated Vital Signs BP 124/87 (BP Location: Left Arm)   Pulse 64   Temp 98.1 F (36.7 C) (Oral)   Resp 16   SpO2 97%   Visual Acuity Right Eye Distance:   Left Eye Distance:   Bilateral Distance:    Right Eye Near:   Left Eye Near:    Bilateral Near:     Physical Exam Vitals reviewed.  Constitutional:      Appearance: He is well-developed. He is ill-appearing.  HENT:     Right Ear: Tympanic membrane normal.     Left Ear: Tympanic membrane normal.  Cardiovascular:  Rate and Rhythm: Normal rate and regular rhythm.  Pulmonary:     Effort: Pulmonary effort is normal.     Breath sounds: Normal breath sounds.  Skin:    General: Skin is warm and dry.  Neurological:     Mental Status: He is alert and oriented to person, place, and time.  Psychiatric:        Mood and Affect: Mood normal.        Behavior: Behavior normal.     UC Treatments / Results  Labs (all labs ordered are listed, but only abnormal results are displayed) Labs Reviewed - No data to display  EKG   Radiology No results found.  Procedures Procedures (including critical care time)  Medications Ordered in UC Medications - No data to display  Initial Impression / Assessment and Plan / UC Course  I have reviewed the triage vital signs and the nursing notes.  Pertinent labs & imaging results that were available during my care of the patient were reviewed by me and considered in my medical decision making (see chart for details).   [name] is a 32 y.o. male presenting with flu like symptoms without documented fever. Patient is afebrile without recent  antipyretics, satting well on room air. Overall is ill appearing though non-toxic, well hydrated, without respiratory distress. Pulmonary exam is unremarkable.  Lungs CTAB without wheezing, rhonchi, rales.  TMs are WNL bilaterally.  No pharyngeal erythema or peritonsillar exudate.  Patient's symptoms are consistent with an acute viral process.  COVID swab was requested and obtained.  Results pending.  Otherwise recommending continued supportive care and use of OTC medication for symptom control.  Counseled patient on potential for adverse effects with medications prescribed/recommended today, ER and return-to-clinic precautions discussed, patient verbalized understanding and agreement with care plan.   Final Clinical Impressions(s) / UC Diagnoses   Final diagnoses:  None   Discharge Instructions   None    ED Prescriptions   None    PDMP not reviewed this encounter.   Charma Igo, [place] 05/24/22 1746

## 2022-05-24 NOTE — Discharge Instructions (Signed)
Follow up here or with your primary care provider if your symptoms are worsening or not improving.     

## 2022-05-24 NOTE — ED Triage Notes (Signed)
Patient presents to UC for cough, runny nose, HA, sore throat, fatigue since Friday. Treating symptoms with dayquil, nyquil, flonase, and ibuprofen. He states he was concerned with night sweats.

## 2022-05-25 ENCOUNTER — Ambulatory Visit: Payer: 59 | Admitting: Family Medicine

## 2022-05-25 LAB — SARS CORONAVIRUS 2 (TAT 6-24 HRS): SARS Coronavirus 2: NEGATIVE

## 2022-05-26 ENCOUNTER — Encounter: Payer: Self-pay | Admitting: Family Medicine

## 2022-05-28 ENCOUNTER — Other Ambulatory Visit: Payer: 59

## 2022-06-04 ENCOUNTER — Encounter: Payer: 59 | Admitting: Family Medicine

## 2022-11-22 ENCOUNTER — Encounter: Payer: Self-pay | Admitting: Family Medicine

## 2022-11-22 ENCOUNTER — Ambulatory Visit: Payer: 59 | Admitting: Family Medicine

## 2022-11-22 VITALS — BP 110/70 | HR 81 | Temp 98.4°F | Ht 69.5 in | Wt 168.5 lb

## 2022-11-22 DIAGNOSIS — E538 Deficiency of other specified B group vitamins: Secondary | ICD-10-CM

## 2022-11-22 DIAGNOSIS — G479 Sleep disorder, unspecified: Secondary | ICD-10-CM | POA: Diagnosis not present

## 2022-11-22 DIAGNOSIS — R5382 Chronic fatigue, unspecified: Secondary | ICD-10-CM | POA: Diagnosis not present

## 2022-11-22 DIAGNOSIS — R7989 Other specified abnormal findings of blood chemistry: Secondary | ICD-10-CM

## 2022-11-22 DIAGNOSIS — R4589 Other symptoms and signs involving emotional state: Secondary | ICD-10-CM | POA: Diagnosis not present

## 2022-11-22 LAB — COMPREHENSIVE METABOLIC PANEL
ALT: 15 U/L (ref 0–53)
AST: 11 U/L (ref 0–37)
Albumin: 4.7 g/dL (ref 3.5–5.2)
Alkaline Phosphatase: 48 U/L (ref 39–117)
BUN: 12 mg/dL (ref 6–23)
CO2: 32 meq/L (ref 19–32)
Calcium: 9.9 mg/dL (ref 8.4–10.5)
Chloride: 103 meq/L (ref 96–112)
Creatinine, Ser: 1.02 mg/dL (ref 0.40–1.50)
GFR: 96.55 mL/min (ref >60.00)
Glucose, Bld: 97 mg/dL (ref 70–99)
Potassium: 4 meq/L (ref 3.5–5.1)
Sodium: 142 meq/L (ref 135–145)
Total Bilirubin: 0.8 mg/dL (ref 0.2–1.2)
Total Protein: 6.8 g/dL (ref 6.0–8.3)

## 2022-11-22 LAB — CBC WITH DIFFERENTIAL/PLATELET
Basophils Absolute: 0.1 10*3/uL (ref 0.0–0.1)
Basophils Relative: 1 % (ref 0.0–3.0)
Eosinophils Absolute: 0 10*3/uL (ref 0.0–0.7)
Eosinophils Relative: 0.6 % (ref 0.0–5.0)
HCT: 47.2 % (ref 39.0–52.0)
Hemoglobin: 15.9 g/dL (ref 13.0–17.0)
Lymphocytes Relative: 30.1 % (ref 12.0–46.0)
Lymphs Abs: 1.6 10*3/uL (ref 0.7–4.0)
MCHC: 33.7 g/dL (ref 30.0–36.0)
MCV: 87.3 fL (ref 78.0–100.0)
Monocytes Absolute: 0.4 10*3/uL (ref 0.1–1.0)
Monocytes Relative: 7.1 % (ref 3.0–12.0)
Neutro Abs: 3.2 10*3/uL (ref 1.4–7.7)
Neutrophils Relative %: 61.2 % (ref 43.0–77.0)
Platelets: 251 10*3/uL (ref 150.0–400.0)
RBC: 5.4 Mil/uL (ref 4.22–5.81)
RDW: 12.9 % (ref 11.5–15.5)
WBC: 5.3 10*3/uL (ref 4.0–10.5)

## 2022-11-22 LAB — TSH: TSH: 0.58 u[IU]/mL (ref 0.35–5.50)

## 2022-11-22 LAB — VITAMIN B12: Vitamin B-12: 661 pg/mL (ref 211–911)

## 2022-11-22 MED ORDER — TRAZODONE HCL 50 MG PO TABS: 25.0000 mg | ORAL_TABLET | Freq: Every day | ORAL | 1 refills | Status: DC

## 2022-11-22 NOTE — Progress Notes (Signed)
Subjective:    Patient ID: Rosalyn Gess, male    DOB: [birth year], 32 y.o.   MRN: [id number]  HPI  Wt Readings from Last 3 Encounters:  11/22/22 168 lb 8 oz (76.4 kg)  09/07/21 186 lb 4 oz (84.5 kg)  01/01/19 180 lb (81.6 kg)   24.53 kg/m  Vitals:   11/22/22 1046  BP: 110/70  Pulse: 81  Temp: 98.4 F (36.9 C)  SpO2: 97%     32 yo pf of Dr Reece Agar presents for sleeping problems He has history of chronic fatigue in past as well as low B12 and testosterone   Problems sleeping  Over a year  Getting worse   Goes to sleep between 9 and 11  Can fall asleep fast  Then wakes up typically by 1 am (jolts himself out of sleep) He uses a sleep app [employer] track of movement   Sometimes he gets up / stretches and then tries to get into sleep zone again Just lies there  Brain goes crazy   Makes him fatigued    Feels stressed/anxious but no new stressors  More of a depressed mood lately  Does not understand why   Daily grind-pushes through     Over thinking things Busy brain   Wonders if his cortisol levels are peaking at night   Has a 1 1/2 y old daughter   Lost his mother 6 years ago  Did not have time to grieve really  Hit him harder when the baby came   Everything hit at once   No family history of mental health issues that he knows of   He is not a talker   Last had counseling - as a kid (did not have a good experience)  Was raised by grandparents / custody issues -tough childhood    Caffeine- has cut back  2-3 sodas per week  Occational sweet tea if he goes out  Used to drink a lot   Etoh-none  No drugs   Not snoring  Does feel like he has some nasal congestion  Feels warm a lot   Appetite is pretty good  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     Lab Results  Component Value Date   NA 138 09/14/2021   K 4.3 09/14/2021   CO2 28 09/14/2021   GLUCOSE 104 (H) 09/14/2021   BUN 21 09/14/2021    CREATININE 1.01 09/14/2021   CALCIUM 9.5 09/14/2021   GFR 98.51 09/14/2021   Lab Results  Component Value Date   ALT 20 09/14/2021   AST 15 09/14/2021   ALKPHOS 57 09/14/2021   BILITOT 0.9 09/14/2021   Lab Results  Component Value Date   VITAMINB12 281 09/14/2021   Lab Results  Component Value Date   WBC 5.6 09/14/2021   HGB 16.0 09/14/2021   HCT 46.3 09/14/2021   MCV 85.4 09/14/2021   PLT 226.0 09/14/2021   Testosterone 240.5 in 08/2021  Lab Results  Component Value Date   HGBA1C 5.1 06/05/2014    Lab Results  Component Value Date   TSH 1.10 09/14/2021    Was told by employer that he can take a week off if he wants to if struggling    Patient Active Problem List   Diagnosis Date Noted   Sleep disorder 11/22/2022   Depressed mood 11/22/2022   Low serum vitamin B12 09/15/2021   Low testosterone in male 09/15/2021   Bradycardia  09/07/2021   Chronic fatigue 09/07/2021   Pes planus 09/07/2021   Past Medical History:  Diagnosis Date   ADHD (attention deficit hyperactivity disorder)    Flat feet, bilateral    History of chicken pox    Past Surgical History:  Procedure Laterality Date   WISDOM TOOTH EXTRACTION Bilateral 2004   Social History   Tobacco Use   Smoking status: Never   Smokeless tobacco: Never  Vaping Use   Vaping status: Never Used  Substance Use Topics   Alcohol use: Yes    Comment: rarely   Drug use: No   Family History  Problem Relation Age of Onset   Breast cancer Mother    Other Father        unknown medical history   Migraines Father    Heart Problems Paternal Grandmother    Pneumonia Paternal Grandmother    Stroke Paternal Grandfather    Diabetes Neg Hx    No Known Allergies No current outpatient medications on file prior to visit.   No current facility-administered medications on file prior to visit.    Review of Systems  Constitutional:  Positive for fatigue. Negative for activity change, appetite change, fever and  unexpected weight change.  HENT:  Negative for congestion, rhinorrhea, sore throat and trouble swallowing.   Eyes:  Negative for pain, redness, itching and visual disturbance.  Respiratory:  Negative for cough, chest tightness, shortness of breath and wheezing.   Cardiovascular:  Negative for chest pain and palpitations.  Gastrointestinal:  Negative for abdominal pain, blood in stool, constipation, diarrhea and nausea.  Endocrine: Negative for cold intolerance, heat intolerance, polydipsia and polyuria.  Genitourinary:  Negative for difficulty urinating, dysuria, frequency and urgency.  Musculoskeletal:  Negative for arthralgias, joint swelling and myalgias.  Skin:  Negative for pallor and rash.  Neurological:  Negative for dizziness, tremors, weakness, numbness and headaches.  Hematological:  Negative for adenopathy. Does not bruise/bleed easily.  Psychiatric/Behavioral:  Positive for decreased concentration, dysphoric mood and sleep disturbance. Negative for agitation, behavioral problems, confusion, self-injury and suicidal ideas. The patient is nervous/anxious.        Objective:   Physical Exam Constitutional:      General: He is not in acute distress.    Appearance: Normal appearance. He is well-developed and normal weight.  HENT:     Head: Normocephalic and atraumatic.  Eyes:     Conjunctiva/sclera: Conjunctivae normal.     Pupils: Pupils are equal, round, and reactive to light.  Neck:     Thyroid: No thyromegaly.     Vascular: No carotid bruit or JVD.  Cardiovascular:     Rate and Rhythm: Normal rate and regular rhythm.     Heart sounds: Normal heart sounds.     No gallop.  Pulmonary:     Effort: Pulmonary effort is normal. No respiratory distress.     Breath sounds: Normal breath sounds. No wheezing or rales.  Abdominal:     General: There is no distension or abdominal bruit.     Palpations: Abdomen is soft.  Musculoskeletal:     Cervical back: Normal range of motion  and neck supple.     Right lower leg: No edema.     Left lower leg: No edema.  Lymphadenopathy:     Cervical: No cervical adenopathy.  Skin:    General: Skin is warm and dry.     Coloration: Skin is not pale.     Findings: No rash.  Neurological:  Mental Status: He is alert.     Cranial Nerves: No cranial nerve deficit.     Motor: No weakness or tremor.     Coordination: Coordination normal.     Deep Tendon Reflexes: Reflexes are normal and symmetric. Reflexes normal.  Psychiatric:        Attention and Perception: Attention normal.        Mood and Affect: Mood is anxious.        Speech: Speech normal.        Behavior: Behavior normal.        Cognition and Memory: Cognition and memory normal.     Comments: Mildly anxious Fatigued Candidly discusses symptoms and stressors    Good insight Very pleasant             Assessment & Plan:   Problem List Items Addressed This Visit       Other   Chronic fatigue    Now sleep problems and anx/dep mood playing a role Lab today  Follow up with pcp   Will try trazodone for mood/sleep      Relevant Orders   Comprehensive metabolic panel   CBC with Differential/Platelet   TSH   Depressed mood    Depressed Some anx also  Sleep issues   Has 1 1/2 yo daughter Some grief re surfaced from loss of mother 76 y ago No SI  Declines counseling  Will try trazodone 50 mg at bedtime for sleep and mood Follow up with pcp in 1-2 weeks if able   Encouraged good self care/ he is working on this  Taking week off from work also      Low serum vitamin B12    Lab today Then follow up with pcp      Relevant Orders   Vitamin B12   Low testosterone in male    Not being treated Will d/w pcp ? If any role in mood change       Sleep disorder - Primary    Pt is struggling with early am awakening with some depressed and anxious mood  No recent change in stress Cutting back on caffeine  Lack of sleep is causing brain fog -was  told to go ahead and take week off of work (loves his job), and fatigue   Declines counseling currently - had bad experience as a kid  Discussed sleep hygiene and handout given Reviewed last lab and pcp note Lab today (does also feel hot a lot and has lost weight)   Plan follow up with pcp

## 2022-11-22 NOTE — Assessment & Plan Note (Signed)
Pt is struggling with early am awakening with some depressed and anxious mood  No recent change in stress Cutting back on caffeine  Lack of sleep is causing brain fog -was told to go ahead and take week off of work (loves his job), and fatigue   Declines counseling currently - had bad experience as a kid  Discussed sleep hygiene and handout given Reviewed last lab and pcp note Lab today (does also feel hot a lot and has lost weight)   Plan follow up with pcp

## 2022-11-22 NOTE — Patient Instructions (Addendum)
Take care of yourself  Avoid caffeine if you can   Try trazodone 50 mg at bedtime for sleep and mood  If any intolerable side effects or if it makes you feel worse instead of better let us know   Labs today   Stop at check out to schedule an appointment with Dr Reece Agar

## 2022-11-22 NOTE — Assessment & Plan Note (Signed)
Not being treated Will d/w pcp ? If any role in mood change

## 2022-11-22 NOTE — Assessment & Plan Note (Signed)
Now sleep problems and anx/dep mood playing a role Lab today  Follow up with pcp   Will try trazodone for mood/sleep

## 2022-11-22 NOTE — Assessment & Plan Note (Signed)
Depressed Some anx also  Sleep issues   Has 1 1/33 yo daughter Some grief re surfaced from loss of mother 61 y ago No SI  Declines counseling  Will try trazodone 50 mg at bedtime for sleep and mood Follow up with pcp in 1-2 weeks if able   Encouraged good self care/ he is working on this  Taking week off from work also

## 2022-11-22 NOTE — Assessment & Plan Note (Signed)
Lab today Then follow up with pcp

## 2022-12-06 ENCOUNTER — Ambulatory Visit: Payer: 59 | Admitting: Family Medicine

## 2022-12-06 ENCOUNTER — Encounter: Payer: Self-pay | Admitting: Family Medicine

## 2022-12-06 VITALS — BP 130/72 | HR 55 | Temp 98.4°F | Ht 69.5 in | Wt 171.2 lb

## 2022-12-06 DIAGNOSIS — G479 Sleep disorder, unspecified: Secondary | ICD-10-CM

## 2022-12-06 DIAGNOSIS — R7989 Other specified abnormal findings of blood chemistry: Secondary | ICD-10-CM | POA: Diagnosis not present

## 2022-12-06 DIAGNOSIS — Z136 Encounter for screening for cardiovascular disorders: Secondary | ICD-10-CM

## 2022-12-06 DIAGNOSIS — Z13220 Encounter for screening for lipoid disorders: Secondary | ICD-10-CM | POA: Diagnosis not present

## 2022-12-06 LAB — LIPID PANEL
Cholesterol: 171 mg/dL (ref 0–200)
HDL: 52.9 mg/dL (ref >39.00)
LDL Cholesterol: 102 mg/dL — ABNORMAL HIGH (ref 0–99)
NonHDL: 118.5
Total CHOL/HDL Ratio: 3
Triglycerides: 82 mg/dL (ref 0.0–149.0)
VLDL: 16.4 mg/dL (ref 0.0–40.0)

## 2022-12-06 LAB — LUTEINIZING HORMONE: LH: 1.9 m[IU]/mL (ref 1.50–9.30)

## 2022-12-06 LAB — FOLLICLE STIMULATING HORMONE: FSH: 2 m[IU]/mL (ref 1.4–18.1)

## 2022-12-06 MED ORDER — TRAZODONE HCL 50 MG PO TABS: 25.0000 mg | ORAL_TABLET | Freq: Every day | ORAL | 6 refills | Status: AC

## 2022-12-06 NOTE — Patient Instructions (Addendum)
Labs today.  Continue trazodone 1/2-1 tablet nightly for sleep.  May try L-theanine supplement for sleep awakenings.

## 2022-12-06 NOTE — Assessment & Plan Note (Addendum)
Doing better on trazodone 50mg  nightly with some am grogginess.  Will trial lower 25mg  dose.  Discussed L-theanine supplement for night time awakenings.  ESS = 3 - low concern for sleep apnea contribution.

## 2022-12-06 NOTE — Progress Notes (Addendum)
Ph: [phone] Fax: [phone]   Patient ID: William Meyers, male    DOB: 1990-07-03, 32 y.o.   MRN: [id number]  This visit was conducted in person.  BP 130/72   Pulse (!) 55   Temp 98.4 F (36.9 C) (Oral)   Ht 5' 9.5" (1.765 m)   Wt 171 lb 4 oz (77.7 kg)   SpO2 98%   BMI 24.93 kg/m    CC: discuss sleep  Subjective:   HPI: William Meyers is a 32 y.o. male presenting on 12/06/2022 for Medical Management of Chronic Issues (Here for mood and insomnia- affecting work.)   See prior note for details.  Saw Dr. Milinda Antis a few weeks ago with longstanding difficulty with sleep, predominantly sleep maintenance.  This is despite using a sleep app (Sleep Cycle). Also with chronic fatigue, previous history of low B12 and testosterone.  Lab work at that time showed normal CBC, CMP, thyroid function and vitamin B12 levels were 661 off B12 replacement.  He was started on trazodone 50 mg half to 1 tablet at night as needed sleep.  He has previously declined counseling.  Sleep hygiene discussed at last visit, handout provided.   Bedtime - 9-11pm.  Sleep latency - a few minutes. Now  Wakes up around 1-3am, then trouble falling back asleep. This can affect work when bad.   Has cut down on caffeine intake to 1 drink/day - has cut out coffee.  Has been cooking more at home - less fast foods.  No alcohol  No smoking  Drinking more coffee   No snoring.  No witnessed apneas.  Endorses non-restorative sleep.  No daytime somnolence. Doesn't take naps.  Ongoing fatigue. Ongoing depressed mood. Notes libido is ok.   He feels he's tolerating trazodone well - desires to continue. May be feeling more anxious. Notes some irritability - overall the same.   15 lb weight loss - overall poor eating habits - fast food.     Relevant past medical, surgical, family and social history reviewed and updated as indicated. Interim medical history since our last visit reviewed. Allergies and medications  reviewed and updated. Outpatient Medications Prior to Visit  Medication Sig Dispense Refill   traZODone (DESYREL) 50 MG tablet Take 0.5-1 tablets (25-50 mg total) by mouth at bedtime. 30 tablet 1   No facility-administered medications prior to visit.     Per HPI unless specifically indicated in ROS section below Review of Systems  Objective:  BP 130/72   Pulse (!) 55   Temp 98.4 F (36.9 C) (Oral)   Ht 5' 9.5" (1.765 m)   Wt 171 lb 4 oz (77.7 kg)   SpO2 98%   BMI 24.93 kg/m   Wt Readings from Last 3 Encounters:  12/06/22 171 lb 4 oz (77.7 kg)  11/22/22 168 lb 8 oz (76.4 kg)  09/07/21 186 lb 4 oz (84.5 kg)      Physical Exam Vitals and nursing note reviewed.  Constitutional:      Appearance: Normal appearance. He is not ill-appearing.  HENT:     Mouth/Throat:     Mouth: Mucous membranes are moist.     Pharynx: Oropharynx is clear. No oropharyngeal exudate or posterior oropharyngeal erythema.  Neck:     Thyroid: No thyroid mass or thyromegaly.  Cardiovascular:     Rate and Rhythm: Regular rhythm. Bradycardia present.     Pulses: Normal pulses.     Heart sounds: Normal heart sounds. No murmur heard.  Pulmonary:     Effort: Pulmonary effort is normal. No respiratory distress.     Breath sounds: Normal breath sounds. No wheezing, rhonchi or rales.  Musculoskeletal:     Cervical back: Normal range of motion and neck supple.     Right lower leg: No edema.     Left lower leg: No edema.  Skin:    General: Skin is warm and dry.     Findings: No rash.  Neurological:     Mental Status: He is alert.  Psychiatric:        Mood and Affect: Mood normal.        Behavior: Behavior normal.       Results for orders placed or performed in visit on 11/22/22  Comprehensive metabolic panel  Result Value Ref Range   Sodium 142 135 - 145 mEq/L   Potassium 4.0 3.5 - 5.1 mEq/L   Chloride 103 96 - 112 mEq/L   CO2 32 19 - 32 mEq/L   Glucose, Bld 97 70 - 99 mg/dL   BUN 12 6 - 23  mg/dL   Creatinine, Ser 4.09 0.40 - 1.50 mg/dL   Total Bilirubin 0.8 0.2 - 1.2 mg/dL   Alkaline Phosphatase 48 39 - 117 U/L   AST 11 0 - 37 U/L   ALT 15 0 - 53 U/L   Total Protein 6.8 6.0 - 8.3 g/dL   Albumin 4.7 3.5 - 5.2 g/dL   GFR 81.19 >14.78 mL/min   Calcium 9.9 8.4 - 10.5 mg/dL  CBC with Differential/Platelet  Result Value Ref Range   WBC 5.3 4.0 - 10.5 K/uL   RBC 5.40 4.22 - 5.81 Mil/uL   Hemoglobin 15.9 13.0 - 17.0 g/dL   HCT 29.5 62.1 - 30.8 %   MCV 87.3 78.0 - 100.0 fl   MCHC 33.7 30.0 - 36.0 g/dL   RDW 65.7 84.6 - 96.2 %   Platelets 251.0 150.0 - 400.0 K/uL   Neutrophils Relative % 61.2 43.0 - 77.0 %   Lymphocytes Relative 30.1 12.0 - 46.0 %   Monocytes Relative 7.1 3.0 - 12.0 %   Eosinophils Relative 0.6 0.0 - 5.0 %   Basophils Relative 1.0 0.0 - 3.0 %   Neutro Abs 3.2 1.4 - 7.7 K/uL   Lymphs Abs 1.6 0.7 - 4.0 K/uL   Monocytes Absolute 0.4 0.1 - 1.0 K/uL   Eosinophils Absolute 0.0 0.0 - 0.7 K/uL   Basophils Absolute 0.1 0.0 - 0.1 K/uL  TSH  Result Value Ref Range   TSH 0.58 0.35 - 5.50 uIU/mL  Vitamin B12  Result Value Ref Range   Vitamin B-12 661 211 - 911 pg/mL      12/06/2022    8:36 AM 09/07/2021    1:49 PM  Depression screen PHQ 2/9  Decreased Interest 0 0  Down, Depressed, Hopeless 1 0  PHQ - 2 Score 1 0  Altered sleeping 1 0  Tired, decreased energy 1 2  Change in appetite 0 0  Feeling bad or failure about yourself  1 0  Trouble concentrating 0 0  Moving slowly or fidgety/restless 0 0  Suicidal thoughts 0 0  PHQ-9 Score 4 2  Difficult doing work/chores Somewhat difficult        12/06/2022    8:36 AM 09/07/2021    1:49 PM  GAD 7 : Generalized Anxiety Score  Nervous, Anxious, on Edge 1 0  Control/stop worrying 1 0  Worry too much - different things 1 0  Trouble relaxing 0 0  Restless 0 0  Easily annoyed or irritable 2 1  Afraid - awful might happen 1 0  Total GAD 7 Score 6 1  Anxiety Difficulty Somewhat difficult Not difficult at all    Assessment & Plan:   Problem List Items Addressed This Visit     Low testosterone in male    Levels low when checked last year.  Update confirmatory labs today with other hormones.  If persistently low, briefly discussed testosterone replacement, QR code provided.       Relevant Orders   Testosterone, Free, Total, SHBG   Luteinizing hormone   Follicle Stimulating Hormone   Prolactin   Sleep disorder - Primary    Doing better on trazodone 50mg  nightly with some am grogginess.  Will trial lower 25mg  dose.  Discussed L-theanine supplement for night time awakenings.  ESS = 3 - low concern for sleep apnea contribution.       Other Visit Diagnoses     Encounter for lipid screening for cardiovascular disease       Relevant Orders   Lipid panel        Meds ordered this encounter  Medications   traZODone (DESYREL) 50 MG tablet    Sig: Take 0.5-1 tablets (25-50 mg total) by mouth at bedtime.    Dispense:  30 tablet    Refill:  6    Orders Placed This Encounter  Procedures   Lipid panel   Testosterone, Free, Total, SHBG   Luteinizing hormone   Follicle Stimulating Hormone   Prolactin    Patient Instructions  Labs today.  Continue trazodone 1/2-1 tablet nightly for sleep.  May try L-theanine supplement for sleep awakenings.   Follow up plan: No follow-ups on file.  Eustaquio Boyden, MD

## 2022-12-06 NOTE — Assessment & Plan Note (Signed)
Levels low when checked last year.  Update confirmatory labs today with other hormones.  If persistently low, briefly discussed testosterone replacement, QR code provided.

## 2022-12-07 LAB — PROLACTIN: Prolactin: 7.8 ng/mL (ref 2.0–18.0)

## 2022-12-09 LAB — TESTOSTERONE, FREE, TOTAL, SHBG
Sex Hormone Binding: 28.3 nmol/L (ref 16.5–55.9)
Testosterone, Free: 9 pg/mL (ref 8.7–25.1)
Testosterone: 298 ng/dL (ref 264–916)

## 2022-12-20 ENCOUNTER — Encounter: Payer: Self-pay | Admitting: Family Medicine

## 2023-02-17 ENCOUNTER — Ambulatory Visit
Admission: EM | Admit: 2023-02-17 | Discharge: 2023-02-17 | Disposition: A | Discharge status: Home / Self Care | Payer: 59 | Attending: Emergency Medicine | Admitting: Emergency Medicine

## 2023-02-17 DIAGNOSIS — R112 Nausea with vomiting, unspecified: Secondary | ICD-10-CM | POA: Diagnosis not present

## 2023-02-17 DIAGNOSIS — B349 Viral infection, unspecified: Secondary | ICD-10-CM | POA: Diagnosis not present

## 2023-02-17 LAB — POC COVID19/FLU A&B COMBO
Covid Antigen, POC: NEGATIVE
Influenza A Antigen, POC: NEGATIVE
Influenza B Antigen, POC: NEGATIVE

## 2023-02-17 MED ORDER — ONDANSETRON 4 MG PO TBDP
4.0000 mg | ORAL_TABLET | Freq: Once | ORAL | Status: AC
  Administered 2023-02-17: 4 mg via ORAL

## 2023-02-17 MED ORDER — ONDANSETRON 4 MG PO TBDP: 4.0000 mg | ORAL_TABLET | Freq: Three times a day (TID) | ORAL | 0 refills | Status: AC | PRN

## 2023-02-17 NOTE — ED Provider Notes (Signed)
[facility]    CSN: [id number] Arrival date & time: 02/17/23  1037      History   Chief Complaint Chief Complaint  Patient presents with   Emesis   Generalized Body Aches    HPI William Meyers is a 33 y.o. male.  Patient presents with 1 day history of fever, congestion, nausea, vomiting.  Tmax 101.  No cough, shortness of breath, diarrhea.  He took ibuprofen this morning at 10 AM.  Last episode of emesis at 8 AM.  The history is provided by the patient and medical records.    Past Medical History:  Diagnosis Date   ADHD (attention deficit hyperactivity disorder)    Flat feet, bilateral    History of chicken pox     Patient Active Problem List   Diagnosis Date Noted   Sleep disorder 11/22/2022   Depressed mood 11/22/2022   Low serum vitamin B12 09/15/2021   Low testosterone in male 09/15/2021   Bradycardia 09/07/2021   Chronic fatigue 09/07/2021   Pes planus 09/07/2021    Past Surgical History:  Procedure Laterality Date   WISDOM TOOTH EXTRACTION Bilateral 2004       Home Medications    Prior to Admission medications   Medication Sig Start Date End Date Taking? Authorizing Provider  ondansetron (ZOFRAN-ODT) 4 MG disintegrating tablet Take 1 tablet (4 mg total) by mouth every 8 (eight) hours as needed for nausea or vomiting. 02/17/23  Yes Mickie Bail, NP  traZODone (DESYREL) 50 MG tablet Take 0.5-1 tablets (25-50 mg total) by mouth at bedtime. 12/06/22  Yes Eustaquio Boyden, MD    Family History Family History  Problem Relation Age of Onset   Breast cancer Mother    Other Father        unknown medical history   Migraines Father    Heart Problems Paternal Grandmother    Pneumonia Paternal Grandmother    Stroke Paternal Grandfather    Diabetes Neg Hx     Social History Social History   Tobacco Use   Smoking status: Never   Smokeless tobacco: Never  Vaping Use   Vaping status: Never Used  Substance Use Topics   Alcohol use: Yes     Comment: rarely   Drug use: No     Allergies   Patient has no known allergies.   Review of Systems Review of Systems  Constitutional:  Positive for fever. Negative for chills.  HENT:  Positive for congestion. Negative for ear pain and sore throat.   Respiratory:  Negative for cough and shortness of breath.   Gastrointestinal:  Positive for nausea and vomiting. Negative for abdominal pain, blood in stool, constipation and diarrhea.     Physical Exam Triage Vital Signs ED Triage Vitals  Encounter Vitals Group     BP      Systolic BP Percentile      Diastolic BP Percentile      Pulse      Resp      Temp      Temp src      SpO2      Weight      Height      Head Circumference      Peak Flow      Pain Score      Pain Loc      Pain Education      Exclude from Growth Chart    No data found.  Updated Vital Signs BP 127/80  Pulse 73   Temp 99.1 F (37.3 C)   Resp 18   SpO2 97%   Visual Acuity Right Eye Distance:   Left Eye Distance:   Bilateral Distance:    Right Eye Near:   Left Eye Near:    Bilateral Near:     Physical Exam Constitutional:      General: He is not in acute distress.    Appearance: He is ill-appearing.  HENT:     Right Ear: Tympanic membrane normal.     Left Ear: Tympanic membrane normal.     Nose: Nose normal.     Mouth/Throat:     Mouth: Mucous membranes are moist.     Pharynx: Oropharynx is clear.  Cardiovascular:     Rate and Rhythm: Normal rate and regular rhythm.     Heart sounds: Normal heart sounds.  Pulmonary:     Effort: Pulmonary effort is normal. No respiratory distress.     Breath sounds: Normal breath sounds.  Abdominal:     General: Bowel sounds are normal.     Palpations: Abdomen is soft.     Tenderness: There is no abdominal tenderness. There is no guarding or rebound.  Neurological:     Mental Status: He is alert.      UC Treatments / Results  Labs (all labs ordered are listed, but only abnormal  results are displayed) Labs Reviewed  POC COVID19/FLU A&B COMBO    EKG   Radiology No results found.  Procedures Procedures (including critical care time)  Medications Ordered in UC Medications  ondansetron (ZOFRAN-ODT) disintegrating tablet 4 mg (4 mg Oral Given 02/17/23 1244)    Initial Impression / Assessment and Plan / UC Course  I have reviewed the triage vital signs and the nursing notes.  Pertinent labs & imaging results that were available during my care of the patient were reviewed by me and considered in my medical decision making (see chart for details).    Nausea and vomiting, viral illness.  Zofran given here and patient able to tolerate oral fluids without emesis.  Rapid flu and COVID-negative.  Treating nausea and vomiting with Zofran.  Discussed clear liquid diet.  Instructed patient to advance diet as tolerated.  ED precautions discussed.  Education provided on nausea and vomiting, viral illness.  Instructed patient to follow-up with his PCP.  He agrees to plan of care.   Final Clinical Impressions(s) / UC Diagnoses   Final diagnoses:  Nausea and vomiting, unspecified vomiting type  Viral illness     Discharge Instructions      The flu and COVID tests are negative.  Take the antinausea medication as directed.    Keep yourself hydrated with clear liquids, such as water.  Advance your diet as tolerated.   Go to the emergency department if you have worsening symptoms.    Follow up with your primary care provider.          ED Prescriptions     Medication Sig Dispense Auth. Provider   ondansetron (ZOFRAN-ODT) 4 MG disintegrating tablet Take 1 tablet (4 mg total) by mouth every 8 (eight) hours as needed for nausea or vomiting. 20 tablet Mickie Bail, NP      PDMP not reviewed this encounter.   Mickie Bail, NP 02/17/23 1248

## 2023-02-17 NOTE — ED Triage Notes (Signed)
Fever, vomiting, congestion, body aches, fatigue that started yesterday.

## 2023-02-17 NOTE — Discharge Instructions (Addendum)
The flu and COVID tests are negative.  Take the antinausea medication as directed.    Keep yourself hydrated with clear liquids, such as water.  Advance your diet as tolerated.   Go to the emergency department if you have worsening symptoms.    Follow up with your primary care provider.

## 2023-02-22 ENCOUNTER — Telehealth (INDEPENDENT_AMBULATORY_CARE_PROVIDER_SITE_OTHER): Payer: 59 | Admitting: Family

## 2023-02-22 ENCOUNTER — Encounter: Payer: Self-pay | Admitting: Family

## 2023-02-22 VITALS — BP 118/68 | HR 90 | Temp 98.6°F | Ht 69.5 in | Wt 167.2 lb

## 2023-02-22 DIAGNOSIS — J069 Acute upper respiratory infection, unspecified: Secondary | ICD-10-CM | POA: Insufficient documentation

## 2023-02-22 NOTE — Assessment & Plan Note (Signed)
 Covid flu and strep reviewed and negative from urgent care visit 02/17/23 Advised patient on supportive measures:  Be sure to rest, drink plenty of fluids, and use tylenol or ibuprofen as needed for pain. Follow up if fever >101, if symptoms worsen or if symptoms are not improved in 3 days. Patient verbalizes understanding.

## 2023-02-22 NOTE — Progress Notes (Signed)
 Virtual Visit via Video note  I connected with Kailyn Dubie Oestreicher on 02/22/23 at the office by video and verified that I am speaking with the correct person using two identifiers.The provider, Ginger Patrick, FNP is located in their home at time of visit.  I discussed the limitations, risks, security and privacy concerns of performing an evaluation and management service by video and the availability of in person appointments. I also discussed with the patient that there may be a patient responsible charge related to this service. The patient expressed understanding and agreed to proceed.  Subjective: PCP: Rilla Baller, MD  Chief Complaint  Patient presents with   Acute Visit    Reports fever, nausea, body aches and headache x4 days. Was tested for COVID and flu and both were negative.    HPI  Five days ago started with chills, nausea body aches and a headache. When he got home felt overly tired and had a fever. He threw up a few times the first day. He also reports he has had a decreased appetite. Yesterday he tried to go back to work and started out well, but when he got home the symptoms came back in full force and last night had a 102 fever.   Was tested for flu and covid 02/17/23 and was negative.   Has not ate or drank this am.  He is coughing that is slightly productive. No sore throat or ear pain.  He does have some slight chest congestion. Not wheezing.     ROS: Per HPI  Current Outpatient Medications:    ondansetron  (ZOFRAN -ODT) 4 MG disintegrating tablet, Take 1 tablet (4 mg total) by mouth every 8 (eight) hours as needed for nausea or vomiting., Disp: 20 tablet, Rfl: 0   traZODone  (DESYREL ) 50 MG tablet, Take 0.5-1 tablets (25-50 mg total) by mouth at bedtime., Disp: 30 tablet, Rfl: 6  Observations/Objective: Physical Exam Constitutional:      General: He is not in acute distress.    Appearance: Normal appearance. He is normal weight. He is not ill-appearing,  toxic-appearing or diaphoretic.  Cardiovascular:     Rate and Rhythm: Normal rate.  Pulmonary:     Effort: Pulmonary effort is normal.  Musculoskeletal:        General: Normal range of motion.  Neurological:     General: No focal deficit present.     Mental Status: He is alert and oriented to person, place, and time. Mental status is at baseline.  Psychiatric:        Mood and Affect: Mood normal.        Behavior: Behavior normal.        Thought Content: Thought content normal.        Judgment: Judgment normal.    Wt Readings from Last 3 Encounters:  02/22/23 167 lb 3.2 oz (75.8 kg)  12/06/22 171 lb 4 oz (77.7 kg)  11/22/22 168 lb 8 oz (76.4 kg)   Temp Readings from Last 3 Encounters:  02/22/23 98.6 F (37 C) (Oral)  02/17/23 99.1 F (37.3 C)  12/06/22 98.4 F (36.9 C) (Oral)   BP Readings from Last 3 Encounters:  02/22/23 118/68  02/17/23 127/80  12/06/22 130/72   Pulse Readings from Last 3 Encounters:  02/22/23 90  02/17/23 73  12/06/22 (!) 55      Assessment and Plan: Viral upper respiratory infection Assessment & Plan: Covid flu and strep reviewed and negative from urgent care visit 02/17/23 Advised patient on supportive  measures:  Be sure to rest, drink plenty of fluids, and use tylenol or ibuprofen as needed for pain. Follow up if fever >101, if symptoms worsen or if symptoms are not improved in 3 days. Patient verbalizes understanding.       Follow Up Instructions: Return for f/u PCP if no improvement in symptoms.   I discussed the assessment and treatment plan with the patient. The patient was provided an opportunity to ask questions and all were answered. The patient agreed with the plan and demonstrated an understanding of the instructions.   The patient was advised to call back or seek an in-person evaluation if the symptoms worsen or if the condition fails to improve as anticipated.  The above assessment and management plan was discussed with the  patient. The patient verbalized understanding of and has agreed to the management plan. Patient is aware to call the clinic if symptoms persist or worsen. Patient is aware when to return to the clinic for a follow-up visit. Patient educated on when it is appropriate to go to the emergency department.     Ginger Patrick, MSN, APRN, FNP-C [facility]

## 2023-02-23 ENCOUNTER — Telehealth: Payer: 59 | Admitting: Physician Assistant

## 2023-02-23 DIAGNOSIS — R509 Fever, unspecified: Secondary | ICD-10-CM

## 2023-02-23 MED ORDER — CIPROFLOXACIN HCL 500 MG PO TABS: 500.0000 mg | ORAL_TABLET | Freq: Two times a day (BID) | ORAL | 0 refills | Status: AC

## 2023-02-23 MED ORDER — SUCRALFATE 1 G PO TABS: 1.0000 g | ORAL_TABLET | Freq: Three times a day (TID) | ORAL | 0 refills | Status: AC

## 2023-02-23 NOTE — Patient Instructions (Signed)
 William Meyers, thank you for joining William William Dickinson, PA-C for today's virtual visit.  While this provider is not your primary care provider (PCP), if your PCP is located in our provider database this encounter information will be shared with them immediately following your visit.   A [facility] MyChart account gives you access to today's visit and all your visits, tests, and labs performed at [facility]  click here if you don't have a [name] MyChart account or go to [email or web address]  Consent: (Patient) William Meyers provided verbal consent for this virtual visit at the beginning of the encounter.  Current Medications:  Current Outpatient Medications:    ciprofloxacin  (CIPRO ) 500 MG tablet, Take 1 tablet (500 mg total) by mouth 2 (two) times daily for 3 days., Disp: 6 tablet, Rfl: 0   sucralfate  (CARAFATE ) 1 g tablet, Take 1 tablet (1 g total) by mouth 4 (four) times daily -  with meals and at bedtime., Disp: 28 tablet, Rfl: 0   ondansetron  (ZOFRAN -ODT) 4 MG disintegrating tablet, Take 1 tablet (4 mg total) by mouth every 8 (eight) hours as needed for nausea or vomiting., Disp: 20 tablet, Rfl: 0   traZODone  (DESYREL ) 50 MG tablet, Take 0.5-1 tablets (25-50 mg total) by mouth at bedtime., Disp: 30 tablet, Rfl: 6   Medications ordered in this encounter:  Meds ordered this encounter  Medications   ciprofloxacin  (CIPRO ) 500 MG tablet    Sig: Take 1 tablet (500 mg total) by mouth 2 (two) times daily for 3 days.    Dispense:  6 tablet    Refill:  0    Supervising Provider:   LAMPTEY, PHILIP Meyers [[id number]]   sucralfate  (CARAFATE ) 1 g tablet    Sig: Take 1 tablet (1 g total) by mouth 4 (four) times daily -  with meals and at bedtime.    Dispense:  28 tablet    Refill:  0    Supervising Provider:   BLAISE ALEENE Meyers [[id number]]     *If you need refills on other medications prior to your next appointment, please contact your pharmacy*  Follow-Up: Call back  or seek an in-person evaluation if the symptoms worsen or if the condition fails to improve as anticipated.  [facility] [phone]  Other Instructions  Fever, Adult     A fever is a high body temperature that is 100.26F (38C) or higher. Brief mild or moderate fevers generally have no lasting effects, and they often do not need treatment. Moderate or high fevers can feel uncomfortable and can sometimes be a sign of a serious problem. Fevers can also cause dehydration because the body may sweat, especially if the fever keeps coming back or lasts a long time. You can use a thermometer to check for a fever. Body temperature can change with: Age. Time of day. Where the temperature is taken, such as in the mouth, rectum, ear, under the arm, or on the forehead. Follow these instructions at home: Medicines Take over-the-counter and prescription medicines only as told by your health care provider. Follow instructions on how much medicine to take and how often. If you were prescribed antibiotics, take them as told by your provider. Do not stop using the antibiotic even if you start to feel better. General instructions Watch for any changes in your symptoms. Let your provider know about them. Rest as needed. Drink enough fluid to keep your pee (urine) pale yellow. This helps to prevent dehydration. Bathe or  sponge bathe with room-temperature water to help lower your body temperature as needed. Do not use cold water. Do not use too many blankets or wear heavy clothes. Stay home from work and public places for at least 24 hours after your fever is gone. Your fever should be gone without having to use medicines. Contact a health care provider if: You have vomiting or diarrhea that does not get better. You cannot eat or drink without vomiting. You have pain when you pee (urinate). Your symptoms do not get better with treatment or you have new symptoms. You have a skin rash. You  have signs of dehydration, such as: Dark pee, very little pee, or no pee. Cracked lips or dry mouth. Sunken eyes. Sleepiness. Weakness. Get help right away if: You have shortness of breath or trouble breathing. You feel dizzy or you faint. You are confused and do not know the time of day, where you are, or who you are (disoriented). You have severe pain in your abdomen. These symptoms may be an emergency. Get help right away. Call 911. Do not wait to see if the symptoms will go away. Do not drive yourself to the hospital. This information is not intended to replace advice given to you by your health care provider. Make sure you discuss any questions you have with your health care provider. Document Revised: 10/06/2021 Document Reviewed: 10/06/2021 Elsevier Patient Education  2024 Elsevier Inc.   If you have been instructed to have an in-person evaluation today at a local Urgent Care facility, please use the link below. It will take you to a list of all of our available [facility], including address, phone number and hours of operation. Please do not delay care.  [facility]  If you or a family member do not have a primary care provider, use the link below to schedule a visit and establish care. When you choose a [facility] primary care physician or advanced practice provider, you gain a long-term partner in health. Find a Primary Care Provider  Learn more about [facility]'s in-office and virtual care options: [facility] - Get Care Now

## 2023-02-23 NOTE — Progress Notes (Signed)
 Virtual Visit Consent   [name], you are scheduled for a virtual visit with a [facility] provider today. Just as with appointments in the office, your consent must be obtained to participate. Your consent will be active for this visit and any virtual visit you may have with one of our providers in the next 365 days. If you have a MyChart account, a copy of this consent can be sent to you electronically.  As this is a virtual visit, video technology does not allow for your provider to perform a traditional examination. This may limit your provider's ability to fully assess your condition. If your provider identifies any concerns that need to be evaluated in person or the need to arrange testing (such as labs, EKG, etc.), we will make arrangements to do so. Although advances in technology are sophisticated, we cannot ensure that it will always work on either your end or our end. If the connection with a video visit is poor, the visit may have to be switched to a telephone visit. With either a video or telephone visit, we are not always able to ensure that we have a secure connection.  By engaging in this virtual visit, you consent to the provision of healthcare and authorize for your insurance to be billed (if applicable) for the services provided during this visit. Depending on your insurance coverage, you may receive a charge related to this service.  I need to obtain your verbal consent now. Are you willing to proceed with your visit today? William Meyers has provided verbal consent on 02/23/2023 for a virtual visit (video or telephone). William CHRISTELLA Dickinson, PA-C  Date: 02/23/2023 10:30 AM  Virtual Visit via Video Note   I, William Meyers, connected with  William Meyers  ([id number], 10/11/1990) on 02/23/23 at 10:15 AM EST by a video-enabled telemedicine application and verified that I am speaking with the correct person using two identifiers.  Location: Patient: Virtual Visit  Location Patient: Home Provider: Virtual Visit Location Provider: Home Office   I discussed the limitations of evaluation and management by telemedicine and the availability of in person appointments. The patient expressed understanding and agreed to proceed.    History of Present Illness: William Meyers is a 33 y.o. who identifies as a male who was assigned male at birth, and is being seen today for continued fevers.  HPI: Fever  This is a new problem. The current episode started in the past 7 days (Started last Wednesday, 02/16/23). The problem occurs constantly. The problem has been unchanged. The maximum temperature noted was 102 to 102.9 F. The temperature was taken using an oral thermometer. Associated symptoms include abdominal pain (stomach cramping on saturday and sunday), congestion, coughing (started in the last day; coughing up small chunks of clear mucous), diarrhea (Saturday and Sunday), headaches, muscle aches, nausea, sleepiness and vomiting. Pertinent negatives include no sore throat. Associated symptoms comments: Cold chills, chilled to bone, myalgias, having nasal congestion over the last day. Treatments tried: tylenol and ibuprofen alternating; having to take every 4 hours, Zofran  for nausea, does help lessen. The treatment provided no relief.   Reports he gets cold and chilled easy at baseline and is heightened when he is sick.  Did have a helper that called out sick on Thursday, but then quit, so unsure if he was sick, and was after he became ill.  No known tick exposure, but does work outdoors.  Was seen on 02/17/23 in person UC and  had Flu/Covid testing that was negative. Was diagnosed with possible viral gastroenteritis and given Zofran .  Fevers improved for a day or so, but then returned again. Was seen Virtually by his PCP office yesterday. Advised probably still Viral illness. Was provided a work note and told to follow up if fever went above 101 again.   He is  following up today because last night his fever went to 102, then came down, then back up to 100, then back down, and last was back to 102 around 3 am this morning.   He continues to have nausea and vomiting, severe chills, body aches, muscle soreness, headache. He has some mild associated nasal and chest congestion with a productive cough that just started yesterday.  He is pushing fluids. He has urinated.   Had a CBC and CMP in 11/2022 that were normal.   Problems:  Patient Active Problem List   Diagnosis Date Noted   Sleep disorder 11/22/2022   Depressed mood 11/22/2022   Low serum vitamin B12 09/15/2021   Low testosterone  in male 09/15/2021   Bradycardia 09/07/2021   Chronic fatigue 09/07/2021   Pes planus 09/07/2021    Allergies: No Known Allergies Medications:  Current Outpatient Medications:    ciprofloxacin  (CIPRO ) 500 MG tablet, Take 1 tablet (500 mg total) by mouth 2 (two) times daily for 3 days., Disp: 6 tablet, Rfl: 0   sucralfate  (CARAFATE ) 1 g tablet, Take 1 tablet (1 g total) by mouth 4 (four) times daily -  with meals and at bedtime., Disp: 28 tablet, Rfl: 0   ondansetron  (ZOFRAN -ODT) 4 MG disintegrating tablet, Take 1 tablet (4 mg total) by mouth every 8 (eight) hours as needed for nausea or vomiting., Disp: 20 tablet, Rfl: 0   traZODone  (DESYREL ) 50 MG tablet, Take 0.5-1 tablets (25-50 mg total) by mouth at bedtime., Disp: 30 tablet, Rfl: 6  Observations/Objective: Patient is well-developed, well-nourished in no acute distress.  Resting comfortably at home.  Head is normocephalic, atraumatic.  No labored breathing.  Speech is clear and coherent with logical content.  Patient is alert and oriented at baseline.    Assessment and Plan: 1. Fever, unspecified fever cause (Primary) - ciprofloxacin  (CIPRO ) 500 MG tablet; Take 1 tablet (500 mg total) by mouth 2 (two) times daily for 3 days.  Dispense: 6 tablet; Refill: 0 - sucralfate  (CARAFATE ) 1 g tablet; Take 1  tablet (1 g total) by mouth 4 (four) times daily -  with meals and at bedtime.  Dispense: 28 tablet; Refill: 0  - DDx: viral illness (suspected Norovirus), bacterial GI infection (food borne), tick borne illness, less likely but unable to rule out: leukemia/lymphoma or other cancer - Will trial Ciprofloxacin  for GI bacteria - Sucralfate  for inflammation of the GI tract - Continue Zofran  for nausea - Continue tylenol and Ibuprofen alternating for fevers and body aches - Push fluids, electrolyte beverages, small sips (2TBSP) every 20 minutes - Bland diet and increase as tolerated - Strict ER precautions if develops lightheadedness, weakness, confusion, decreased urine output - Follow up in person if symptoms worsen or if fails to improve over next 48 hours  Follow Up Instructions: I discussed the assessment and treatment plan with the patient. The patient was provided an opportunity to ask questions and all were answered. The patient agreed with the plan and demonstrated an understanding of the instructions.  A copy of instructions were sent to the patient via MyChart unless otherwise noted below.    The patient was  advised to call back or seek an in-person evaluation if the symptoms worsen or if the condition fails to improve as anticipated.    William CHRISTELLA Dickinson, PA-C

## 2023-02-25 LAB — LAB REPORT - SCANNED: EGFR: 90

## 2023-02-28 ENCOUNTER — Ambulatory Visit: Payer: 59 | Admitting: Family Medicine

## 2023-03-02 ENCOUNTER — Ambulatory Visit: Payer: Self-pay | Admitting: Family Medicine

## 2023-03-02 NOTE — Telephone Encounter (Signed)
  Chief Complaint: heaviness to chest Symptoms: anxious feeling, R upper CP, SOB, night sweats, general, productive cough, malaise Frequency: 2-3 days Pertinent Negatives: Patient denies fever Disposition: [x] ED /[] Urgent Care (no appt availability in office) / [] Appointment(In office/virtual)/ []  [facility]/ [] Home Care/ [] Refused Recommended Disposition /[] [place] [facility]/ []  Follow-up with PCP  Additional Notes: Pt recently dx with flu B. Pt states that now he is experiencing heavier breathing than normal. Pt states that he had originally had a sharp pain in his chest with a cough. Today he is also experiencing that pain and now pressure with a deep breath.  Pt states numerous times in the call that his chest feels heavy and that he has a very anxious feeling, which he has never had before. Pt does not have equip to check his spo2. Pt audibly sounds SOB. Confirms night sweats. Pt advised to seek ED treatment, pt confirms he will go to ED.  Copied from CRM [id number]. Topic: Clinical - Red Word Triage >> Mar 02, 2023  9:11 AM Maxwell Marion wrote: Red Word that prompted transfer to Nurse Triage: patient states his breathing feels off, feels heavy. Also having a sharp pain in chest area and increased cough. Was recently seen on 2/7 and diagnosed with Influenza B. Reason for Disposition  Patient sounds very sick or weak to the triager  Answer Assessment - Initial Assessment Questions 1. RESPIRATORY STATUS: "Describe your breathing?" (e.g., wheezing, shortness of breath, unable to speak, severe coughing)      Pain with deep breath and heaviness with breathing 2. ONSET: "When did this breathing problem begin?"      2-3 days 3. PATTERN "Does the difficult breathing come and go, or has it been constant since it started?"      constant 4. SEVERITY: "How bad is your breathing?" (e.g., mild, moderate, severe)    - MILD: No SOB at rest, mild SOB with walking, speaks normally in sentences,  can lie down, no retractions, pulse < 100.    - MODERATE: SOB at rest, SOB with minimal exertion and prefers to sit, cannot lie down flat, speaks in phrases, mild retractions, audible wheezing, pulse 100-120.    - SEVERE: Very SOB at rest, speaks in single words, struggling to breathe, sitting hunched forward, retractions, pulse > 120      moderate 5. RECURRENT SYMPTOM: "Have you had difficulty breathing before?" If Yes, ask: "When was the last time?" and "What happened that time?"      denies 6. CARDIAC HISTORY: "Do you have any history of heart disease?" (e.g., heart attack, angina, bypass surgery, angioplasty)      denies 7. LUNG HISTORY: "Do you have any history of lung disease?"  (e.g., pulmonary embolus, asthma, emphysema)     denies 8. CAUSE: "What do you think is causing the breathing problem?"      unsure 9. OTHER SYMPTOMS: "Do you have any other symptoms? (e.g., dizziness, runny nose, cough, chest pain, fever)     Cough, runny nose, CP 10. O2 SATURATION MONITOR:  "Do you use an oxygen saturation monitor (pulse oximeter) at home?" If Yes, ask: "What is your reading (oxygen level) today?" "What is your usual oxygen saturation reading?" (e.g., 95%)       No equip  12. TRAVEL: "Have you traveled out of the country in the last month?" (e.g., travel history, exposures)       denies  Protocols used: Breathing Difficulty-A-AH

## 2023-03-02 NOTE — Telephone Encounter (Signed)
Seen at [facility] ER with normal CXR, ongoing flu B symptoms.  He had cancelled Mondays' appt with me.  Would offer f/u OV Friday 1pm if desires to be rechecked.

## 2023-03-03 NOTE — Telephone Encounter (Signed)
[email or web address]
